# Patient Record
Sex: Female | Born: 1965 | Race: White | Hispanic: No | Marital: Married | Smoking: Former smoker
Health system: Southern US, Community
[De-identification: ages and names within clinical notes are randomized; demographics above are authoritative.]

## PROBLEM LIST (undated history)

## (undated) DIAGNOSIS — K219 Gastro-esophageal reflux disease without esophagitis: Secondary | ICD-10-CM

## (undated) DIAGNOSIS — Z720 Tobacco use: Secondary | ICD-10-CM

## (undated) DIAGNOSIS — J45909 Unspecified asthma, uncomplicated: Secondary | ICD-10-CM

## (undated) DIAGNOSIS — I10 Essential (primary) hypertension: Secondary | ICD-10-CM

---

## 1999-01-05 HISTORY — PX: CHOLECYSTECTOMY: SHX55

## 2005-12-20 ENCOUNTER — Ambulatory Visit: Payer: Self-pay | Admitting: Family Medicine

## 2006-03-20 ENCOUNTER — Emergency Department: Payer: Self-pay | Admitting: Emergency Medicine

## 2006-08-23 ENCOUNTER — Emergency Department: Payer: Self-pay | Admitting: Emergency Medicine

## 2006-10-12 ENCOUNTER — Ambulatory Visit: Payer: Self-pay

## 2006-10-17 ENCOUNTER — Emergency Department: Payer: Self-pay

## 2009-11-29 ENCOUNTER — Emergency Department: Payer: Self-pay | Admitting: Emergency Medicine

## 2012-01-07 ENCOUNTER — Emergency Department: Payer: Self-pay | Admitting: Emergency Medicine

## 2012-11-15 ENCOUNTER — Emergency Department: Payer: Self-pay | Admitting: Emergency Medicine

## 2014-02-27 ENCOUNTER — Emergency Department: Payer: Self-pay | Admitting: Emergency Medicine

## 2014-06-03 ENCOUNTER — Emergency Department
Admission: EM | Admit: 2014-06-03 | Discharge: 2014-06-03 | Disposition: A | Discharge status: Home / Self Care | Payer: Self-pay | Attending: Emergency Medicine | Admitting: Emergency Medicine

## 2014-06-03 ENCOUNTER — Encounter: Payer: Self-pay | Admitting: Emergency Medicine

## 2014-06-03 ENCOUNTER — Emergency Department: Payer: Self-pay

## 2014-06-03 DIAGNOSIS — Z720 Tobacco use: Secondary | ICD-10-CM | POA: Insufficient documentation

## 2014-06-03 DIAGNOSIS — Z3202 Encounter for pregnancy test, result negative: Secondary | ICD-10-CM | POA: Insufficient documentation

## 2014-06-03 DIAGNOSIS — K529 Noninfective gastroenteritis and colitis, unspecified: Secondary | ICD-10-CM | POA: Insufficient documentation

## 2014-06-03 LAB — CBC WITH DIFFERENTIAL/PLATELET
Basophils Absolute: 0.1 10*3/uL (ref 0–0.1)
Basophils Relative: 1 %
Eosinophils Absolute: 0 10*3/uL (ref 0–0.7)
Eosinophils Relative: 0 %
HCT: 46 % (ref 35.0–47.0)
Hemoglobin: 15.6 g/dL (ref 12.0–16.0)
Lymphocytes Relative: 15 %
Lymphs Abs: 1.3 10*3/uL (ref 1.0–3.6)
MCH: 29.4 pg (ref 26.0–34.0)
MCHC: 33.8 g/dL (ref 32.0–36.0)
MCV: 86.8 fL (ref 80.0–100.0)
MONO ABS: 0.3 10*3/uL (ref 0.2–0.9)
Monocytes Relative: 3 %
NEUTROS ABS: 6.9 10*3/uL — AB (ref 1.4–6.5)
NEUTROS PCT: 81 %
PLATELETS: 346 10*3/uL (ref 150–440)
RBC: 5.3 MIL/uL — ABNORMAL HIGH (ref 3.80–5.20)
RDW: 12.9 % (ref 11.5–14.5)
WBC: 8.5 10*3/uL (ref 3.6–11.0)

## 2014-06-03 LAB — COMPREHENSIVE METABOLIC PANEL
ALK PHOS: 82 U/L (ref 38–126)
ALT: 17 U/L (ref 14–54)
ANION GAP: 11 (ref 5–15)
AST: 21 U/L (ref 15–41)
Albumin: 4.6 g/dL (ref 3.5–5.0)
BILIRUBIN TOTAL: 0.4 mg/dL (ref 0.3–1.2)
BUN: 12 mg/dL (ref 6–20)
CALCIUM: 8.8 mg/dL — AB (ref 8.9–10.3)
CHLORIDE: 96 mmol/L — AB (ref 101–111)
CO2: 26 mmol/L (ref 22–32)
CREATININE: 0.73 mg/dL (ref 0.44–1.00)
GFR calc Af Amer: >60 mL/min (ref >60)
GFR calc non Af Amer: >60 mL/min (ref >60)
GLUCOSE: 128 mg/dL — AB (ref 65–99)
POTASSIUM: 3.3 mmol/L — AB (ref 3.5–5.1)
Sodium: 133 mmol/L — ABNORMAL LOW (ref 135–145)
Total Protein: 8.1 g/dL (ref 6.5–8.1)

## 2014-06-03 LAB — URINALYSIS COMPLETE WITH MICROSCOPIC (ARMC ONLY)
Bacteria, UA: NONE SEEN
Bilirubin Urine: NEGATIVE
Glucose, UA: NEGATIVE mg/dL
KETONES UR: NEGATIVE mg/dL
Leukocytes, UA: NEGATIVE
Nitrite: NEGATIVE
PH: 7 (ref 5.0–8.0)
PROTEIN: NEGATIVE mg/dL
Specific Gravity, Urine: 1.02 (ref 1.005–1.030)

## 2014-06-03 LAB — PREGNANCY, URINE: Preg Test, Ur: NEGATIVE

## 2014-06-03 MED ORDER — SODIUM CHLORIDE 0.9 % IV BOLUS (SEPSIS)
1000.0000 mL | Freq: Once | INTRAVENOUS | Status: AC
  Administered 2014-06-03: 1000 mL via INTRAVENOUS

## 2014-06-03 MED ORDER — PROMETHAZINE HCL 25 MG/ML IJ SOLN
INTRAMUSCULAR | Status: AC
  Administered 2014-06-03: 25 mg via INTRAVENOUS
  Filled 2014-06-03: qty 1

## 2014-06-03 MED ORDER — PROMETHAZINE HCL 25 MG/ML IJ SOLN
25.0000 mg | Freq: Once | INTRAMUSCULAR | Status: AC
  Administered 2014-06-03: 25 mg via INTRAVENOUS

## 2014-06-03 MED ORDER — PROMETHAZINE HCL 12.5 MG PO TABS: 12.5000 mg | ORAL_TABLET | Freq: Four times a day (QID) | ORAL | Status: DC | PRN

## 2014-06-03 NOTE — ED Notes (Signed)
Assisted patient up to bathroom and back into bed. 

## 2014-06-03 NOTE — ED Notes (Signed)
Patient assisted up to bathroom and back to into bed.

## 2014-06-03 NOTE — ED Notes (Signed)
C/o n,v,d that started today, denies any abd. Pain at present

## 2014-06-03 NOTE — ED Notes (Signed)

## 2014-06-03 NOTE — ED Notes (Signed)
Patient transported to X-ray 

## 2014-06-03 NOTE — ED Notes (Signed)
Patient transported back from X-ray 

## 2014-06-03 NOTE — Discharge Instructions (Signed)
Viral Gastroenteritis Viral gastroenteritis is also called stomach flu. This illness is caused by a certain type of germ (virus). It can cause sudden watery poop (diarrhea) and throwing up (vomiting). This can cause you to lose body fluids (dehydration). This illness usually lasts for 3 to 8 days. It usually goes away on its own. HOME CARE   Drink enough fluids to keep your pee (urine) clear or pale yellow. Drink small amounts of fluids often.  Ask your doctor how to replace body fluid losses (rehydration).  Avoid:  Foods high in sugar.  Alcohol.  Bubbly (carbonated) drinks.  Tobacco.  Juice.  Caffeine drinks.  Very hot or cold fluids.  Fatty, greasy foods.  Eating too much at one time.  Dairy products until 24 to 48 hours after your watery poop stops.  You may eat foods with active cultures (probiotics). They can be found in some yogurts and supplements.  Wash your hands well to avoid spreading the illness.  Only take medicines as told by your doctor. Do not give aspirin to children. Do not take medicines for watery poop (antidiarrheals).  Ask your doctor if you should keep taking your regular medicines.  Keep all doctor visits as told. GET HELP RIGHT AWAY IF:   You cannot keep fluids down.  You do not pee at least once every 6 to 8 hours.  You are short of breath.  You see blood in your poop or throw up. This may look like coffee grounds.  You have belly (abdominal) pain that gets worse or is just in one small spot (localized).  You keep throwing up or having watery poop.  You have a fever.  The patient is a child younger than 3 months, and he or she has a fever.  The patient is a child older than 3 months, and he or she has a fever and problems that do not go away.  The patient is a child older than 3 months, and he or she has a fever and problems that suddenly get worse.  The patient is a baby, and he or she has no tears when crying. MAKE SURE YOU:     Understand these instructions.  Will watch your condition.  Will get help right away if you are not doing well or get worse. Document Released: 06/09/2007 Document Revised: 03/15/2011 Document Reviewed: 10/07/2010 Exit[employer] Patient Information 2015 [employer]. This information is not intended to replace advice given to you by your health care provider. Make sure you discuss any questions you have with your health care provider.  

## 2014-06-03 NOTE — ED Notes (Signed)
Patient presents to ED with complaint of nausea, vomiting, diarrhea since about noon today, patient reports has slight abdominal pain "I think its from all the vomiting I have been doing." Patient states has had 6-7 emesis episodes and 2 diarrhea episodes. Patient also complaining of severe migraine. Patient denies any blood in emesis or stool. Denies any other symptoms. Patient alert and oriented, respirations even and unlabored. Will continue to monitor.

## 2014-06-03 NOTE — ED Provider Notes (Signed)
[facility] Emergency Department Provider Note  Time seen: 6:52 PM  I have reviewed the triage vital signs and the nursing notes.   HISTORY  Chief Complaint Emesis and Diarrhea    HPI Vickie Hamilton is a 49 y.o. female with no past medical history who presents the emergency department with nausea/vomiting/diarrhea for last 4 hours. According to the patient she developed nausea/vomiting, which she states is not too atypical for her since 2001 when she had her gallbladder removed. However she states that vomiting turned a bright green color which concerned her so she came to the emergency department for evaluation. She states some diarrhea and denies any blood or black. Denies any abdominal pain. Denies any dysuria. Denies any fever.    No past medical history on file.  There are no active problems to display for this patient.   No past surgical history on file.  No current outpatient prescriptions on file.  Allergies Asa and Naproxen  No family history on file.  Social History History  Substance Use Topics  . Smoking status: Current Every Day Smoker  . Smokeless tobacco: Not on file  . Alcohol Use: No    Review of Systems Constitutional: Negative for fever. Cardiovascular: Negative for chest pain. Respiratory: Negative for shortness of breath. Gastrointestinal: Negative for abdominal pain. Positive for nausea/vomiting/diarrhea. Genitourinary: Negative for dysuria. Musculoskeletal: Negative for back pain. 10-point ROS otherwise negative.  ____________________________________________   PHYSICAL EXAM:  VITAL SIGNS: ED Triage Vitals  Enc Vitals Group     BP 06/03/14 1734 157/97 mmHg     Pulse Rate 06/03/14 1734 104     Resp 06/03/14 1734 20     Temp 06/03/14 1734 98.2 F (36.8 C)     Temp Source 06/03/14 1734 Oral     SpO2 06/03/14 1734 96 %     Weight 06/03/14 1734 175 lb (79.379 kg)     Height 06/03/14 1734 5\' 4"  (1.626 m)   Head Cir --      Peak Flow --      Pain Score --      Pain Loc --      Pain Edu? --      Excl. in GC? --     Constitutional: Alert and oriented. Well appearing and in no distress. ENT   Head: Normocephalic   Mouth/Throat: Mucous membranes are moist. Cardiovascular: Normal rate, regular rhythm. No murmurs Respiratory: Normal respiratory effort without tachypnea nor retractions. Breath sounds are clear  Gastrointestinal: Soft and nontender. No distention.  Benign abdominal exam. Musculoskeletal: Nontender with normal range of motion in all extremities Neurologic:  Normal speech and language. No gross focal neurologic deficits  Skin:  Skin is warm, dry and intact.  Psychiatric: Mood and affect are normal. Speech and behavior are normal.  ____________________________________________    RADIOLOGY three-way abdominal exam within normal limits.  ____________________________________________   INITIAL IMPRESSION / ASSESSMENT AND PLAN / ED COURSE  Pertinent labs & imaging results that were available during my care of the patient were reviewed by me and considered in my medical decision making (see chart for details).  Nausea, vomiting, diarrhea 4 hours. Patient has no abdominal tenderness to palpation. We will check labs, IV hydrate, control nausea. Patient agreeable to plan. Overall patient appears very well currently.   ----------------------------------------- 8:05 PM on 06/03/2014 -----------------------------------------  Urinalysis is within normal limits.  Chest x-rays come back within normal limits. Patient states she feels much better after fluids and Phenergan. We  will discharge home with Phenergan as needed, and primary care follow-up. Discussed strict return precautions with the patient. ____________________________________________   FINAL CLINICAL IMPRESSION(S) / ED DIAGNOSES  Nausea/vomiting Diarrhea   Minna Antis, MD 06/03/14 2057

## 2015-01-02 ENCOUNTER — Emergency Department: Payer: Managed Care, Other (non HMO)

## 2015-01-02 ENCOUNTER — Encounter: Payer: Self-pay | Admitting: *Deleted

## 2015-01-02 ENCOUNTER — Other Ambulatory Visit: Payer: Self-pay

## 2015-01-02 ENCOUNTER — Emergency Department
Admission: EM | Admit: 2015-01-02 | Discharge: 2015-01-02 | Disposition: A | Discharge status: Home / Self Care | Payer: Managed Care, Other (non HMO) | Attending: Emergency Medicine | Admitting: Emergency Medicine

## 2015-01-02 DIAGNOSIS — F17200 Nicotine dependence, unspecified, uncomplicated: Secondary | ICD-10-CM | POA: Diagnosis not present

## 2015-01-02 DIAGNOSIS — J9801 Acute bronchospasm: Secondary | ICD-10-CM | POA: Insufficient documentation

## 2015-01-02 DIAGNOSIS — R05 Cough: Secondary | ICD-10-CM | POA: Diagnosis present

## 2015-01-02 MED ORDER — METHYLPREDNISOLONE 4 MG PO TBPK
ORAL_TABLET | ORAL | Status: DC
Start: 2015-01-02 — End: 2021-07-30

## 2015-01-02 MED ORDER — IPRATROPIUM-ALBUTEROL 0.5-2.5 (3) MG/3ML IN SOLN
3.0000 mL | Freq: Once | RESPIRATORY_TRACT | Status: AC
  Administered 2015-01-02: 3 mL via RESPIRATORY_TRACT
  Filled 2015-01-02: qty 3

## 2015-01-02 MED ORDER — HYDROCOD POLST-CPM POLST ER 10-8 MG/5ML PO SUER: 5.0000 mL | Freq: Two times a day (BID) | ORAL | Status: DC

## 2015-01-02 NOTE — Discharge Instructions (Signed)
Bronchospasm, Adult A bronchospasm is when the tubes that carry air in and out of your lungs (airways) spasm or tighten. During a bronchospasm it is hard to breathe. This is because the airways get smaller. A bronchospasm can be triggered by:  Allergies. These may be to animals, pollen, food, or mold.  Infection. This is a common cause of bronchospasm.  Exercise.  Irritants. These include pollution, cigarette smoke, strong odors, aerosol sprays, and paint fumes.  Weather changes.  Stress.  Being emotional. HOME CARE   Always have a plan for getting help. Know when to call your doctor and local emergency services (911 in the [place] Know where you can get emergency care.  Only take medicines as told by your doctor.  If you were prescribed an inhaler or nebulizer machine, ask your doctor how to use it correctly. Always use a spacer with your inhaler if you were given one.  Stay calm during an attack. Try to relax and breathe more slowly.  Control your home environment:  Change your heating and air conditioning filter at least once a month.  Limit your use of fireplaces and wood stoves.  Do not  smoke. Do not  allow smoking in your home.  Avoid perfumes and fragrances.  Get rid of pests (such as roaches and mice) and their droppings.  Throw away plants if you see mold on them.  Keep your house clean and dust free.  Replace carpet with wood, tile, or vinyl flooring. Carpet can trap dander and dust.  Use allergy-proof pillows, mattress covers, and box spring covers.  Wash bed sheets and blankets every week in hot water. Dry them in a dryer.  Use blankets that are made of polyester or cotton.  Wash hands frequently. GET HELP IF:  You have muscle aches.  You have chest pain.  The thick spit you spit or cough up (sputum) changes from clear or white to yellow, green, gray, or bloody.  The thick spit you spit or cough up gets thicker.  There are problems that may be  related to the medicine you are given such as:  A rash.  Itching.  Swelling.  Trouble breathing. GET HELP RIGHT AWAY IF:  You feel you cannot breathe or catch your breath.  You cannot stop coughing.  Your treatment is not helping you breathe better.  You have very bad chest pain. MAKE SURE YOU:   Understand these instructions.  Will watch your condition.  Will get help right away if you are not doing well or get worse.   This information is not intended to replace advice given to you by your health care provider. Make sure you discuss any questions you have with your health care provider.   Document Released: 10/18/2008 Document Revised: 01/11/2014 Document Reviewed: 06/13/2012 Elsevier Interactive Patient Education 2016 [employer]  

## 2015-01-02 NOTE — ED Notes (Signed)
Assessed per PA 

## 2015-01-02 NOTE — ED Notes (Signed)
C/o cough, cold symptoms, chest tightness,

## 2015-01-02 NOTE — ED Provider Notes (Signed)
[facility] Emergency Department Provider Note  ____________________________________________  Time seen: Approximately 4:54 PM  I have reviewed the triage vital signs and the nursing notes.   HISTORY  Chief Complaint Cough    HPI Sampson Gooneresa L Bleiler is a 49 y.o. female patient complaining of 3 weeks of cough congestion and chest tightness. Patient also complaining of nasal congestion postnasal drainage. Patient denies any nausea vomiting diarrhea. Patient denies any chest pain. Patient states exertional shortness of breath.Patient admits to tobacco use. No palliative measures taken. Patient denies any pain at this time.  History reviewed. No pertinent past medical history.  There are no active problems to display for this patient.   History reviewed. No pertinent past surgical history.  Current Outpatient Rx  Name  Route  Sig  Dispense  Refill  . chlorpheniramine-HYDROcodone (TUSSIONEX PENNKINETIC ER) 10-8 MG/5ML SUER   Oral   Take 5 mLs by mouth 2 (two) times daily.   115 mL   0   . methylPREDNISolone (MEDROL DOSEPAK) 4 MG TBPK tablet      Take Tapered dose as directed   21 tablet   0   . promethazine (PHENERGAN) 12.5 MG tablet   Oral   Take 1 tablet (12.5 mg total) by mouth every 6 (six) hours as needed for nausea or vomiting.   15 tablet   0     Allergies Asa and Naproxen  No family history on file.  Social History Social History  Substance Use Topics  . Smoking status: Current Every Day Smoker  . Smokeless tobacco: None  . Alcohol Use: No    Review of Systems Constitutional: No fever/chills Eyes: No visual changes. ENT: No sore throat. Cardiovascular: Denies chest pain. Respiratory: Exertional shortness of breath. Nonproductive cough. Chest congestion Gastrointestinal: No abdominal pain.  No nausea, no vomiting.  No diarrhea.  No constipation. Genitourinary: Negative for dysuria. Musculoskeletal: Negative for back  pain. Skin: Negative for rash. Neurological: Negative for headaches, focal weakness or numbness. Allergic/Immunilogical: Aspirin and naproxen  10-point ROS otherwise negative.  ____________________________________________   PHYSICAL EXAM:  VITAL SIGNS: ED Triage Vitals  Enc Vitals Group     BP 01/02/15 1616 165/91 mmHg     Pulse Rate 01/02/15 1616 93     Resp 01/02/15 1616 24     Temp 01/02/15 1616 98.7 F (37.1 C)     Temp Source 01/02/15 1616 Oral     SpO2 01/02/15 1616 96 %     Weight 01/02/15 1616 180 lb (81.647 kg)     Height 01/02/15 1616 5\' 4"  (1.626 m)     Head Cir --      Peak Flow --      Pain Score --      Pain Loc --      Pain Edu? --      Excl. in GC? --     Constitutional: Alert and oriented. Well appearing and in no acute distress. Eyes: Conjunctivae are normal. PERRL. EOMI. Head: Atraumatic. Nose: No congestion/rhinnorhea. Mouth/Throat: Mucous membranes are moist.  Oropharynx non-erythematous. Neck: No stridor. No cervical spine tenderness to palpation. Hematological/Lymphatic/Immunilogical: No cervical lymphadenopathy. Cardiovascular: Normal rate, regular rhythm. Grossly normal heart sounds.  Good peripheral circulation. Elevated blood pressure  Respiratory: Normal respiratory effort.  No retractions.mild Rales. Gastrointestinal: Soft and nontender. No distention. No abdominal bruits. No CVA tenderness. Musculoskeletal: No lower extremity tenderness nor edema.  No joint effusions. Neurologic:  Normal speech and language. No gross focal neurologic deficits are  appreciated. No gait instability. Skin:  Skin is warm, dry and intact. No rash noted. Psychiatric: Mood and affect are normal. Speech and behavior are normal.  ____________________________________________   LABS (all labs ordered are listed, but only abnormal results are displayed)  Labs Reviewed - No data to display __________________________________Read by heart station doctors no acute  findings._____________________  RADIOLOGY  No acute findings on chest x-ray ____________________________________________   PROCEDURES  Procedure(s) performed: None  Critical Care performed: No  ____________________________________________   INITIAL IMPRESSION / ASSESSMENT AND PLAN / ED COURSE  Pertinent labs & imaging results that were available during my care of the patient were reviewed by me and considered in my medical decision making (see chart for details).  Cough secondary to bronchospasm. As patient discontinue smoking. She given a prescription for Bromfed-DM prednisone. [facility] Patient given a work note for today. ____________________________________________   FINAL CLINICAL IMPRESSION(S) / ED DIAGNOSES  Final diagnoses:  Cough due to bronchospasm      Joni Reining, PA-C 01/02/15 1821  Phineas Semen, MD 01/07/15 [phone]

## 2015-06-06 ENCOUNTER — Emergency Department: Payer: Managed Care, Other (non HMO)

## 2015-06-06 ENCOUNTER — Emergency Department
Admission: EM | Admit: 2015-06-06 | Discharge: 2015-06-06 | Disposition: A | Discharge status: Home / Self Care | Payer: Managed Care, Other (non HMO) | Attending: Emergency Medicine | Admitting: Emergency Medicine

## 2015-06-06 DIAGNOSIS — F17200 Nicotine dependence, unspecified, uncomplicated: Secondary | ICD-10-CM | POA: Diagnosis not present

## 2015-06-06 DIAGNOSIS — J209 Acute bronchitis, unspecified: Secondary | ICD-10-CM | POA: Insufficient documentation

## 2015-06-06 DIAGNOSIS — E876 Hypokalemia: Secondary | ICD-10-CM

## 2015-06-06 DIAGNOSIS — R0789 Other chest pain: Secondary | ICD-10-CM | POA: Diagnosis present

## 2015-06-06 LAB — COMPREHENSIVE METABOLIC PANEL
ALBUMIN: 4.3 g/dL (ref 3.5–5.0)
ALK PHOS: 84 U/L (ref 38–126)
ALT: 20 U/L (ref 14–54)
AST: 27 U/L (ref 15–41)
Anion gap: 10 (ref 5–15)
BILIRUBIN TOTAL: 0.5 mg/dL (ref 0.3–1.2)
BUN: 12 mg/dL (ref 6–20)
CALCIUM: 9.3 mg/dL (ref 8.9–10.3)
CO2: 25 mmol/L (ref 22–32)
Chloride: 105 mmol/L (ref 101–111)
Creatinine, Ser: 0.83 mg/dL (ref 0.44–1.00)
GFR calc Af Amer: >60 mL/min (ref >60)
GFR calc non Af Amer: >60 mL/min (ref >60)
GLUCOSE: 175 mg/dL — AB (ref 65–99)
Potassium: 3.1 mmol/L — ABNORMAL LOW (ref 3.5–5.1)
SODIUM: 140 mmol/L (ref 135–145)
TOTAL PROTEIN: 7.4 g/dL (ref 6.5–8.1)

## 2015-06-06 LAB — CBC
HCT: 41 % (ref 35.0–47.0)
HEMOGLOBIN: 14.3 g/dL (ref 12.0–16.0)
MCH: 29.6 pg (ref 26.0–34.0)
MCHC: 34.9 g/dL (ref 32.0–36.0)
MCV: 84.9 fL (ref 80.0–100.0)
Platelets: 293 10*3/uL (ref 150–440)
RBC: 4.83 MIL/uL (ref 3.80–5.20)
RDW: 13.3 % (ref 11.5–14.5)
WBC: 8.7 10*3/uL (ref 3.6–11.0)

## 2015-06-06 LAB — TROPONIN I: Troponin I: <0.03 ng/mL (ref <0.031)

## 2015-06-06 MED ORDER — HYDROCODONE-HOMATROPINE 5-1.5 MG/5ML PO SYRP: 5.0000 mL | ORAL_SOLUTION | Freq: Four times a day (QID) | ORAL | Status: DC | PRN

## 2015-06-06 MED ORDER — AMOXICILLIN-POT CLAVULANATE 875-125 MG PO TABS: 1.0000 | ORAL_TABLET | Freq: Two times a day (BID) | ORAL | Status: DC

## 2015-06-06 MED ORDER — ALBUTEROL SULFATE (2.5 MG/3ML) 0.083% IN NEBU
2.5000 mg | INHALATION_SOLUTION | Freq: Once | RESPIRATORY_TRACT | Status: AC
Start: 2015-06-06 — End: 2015-06-06
  Administered 2015-06-06: 2.5 mg via RESPIRATORY_TRACT
  Filled 2015-06-06: qty 3

## 2015-06-06 MED ORDER — PREDNISONE 10 MG PO TABS: ORAL_TABLET | ORAL | Status: DC

## 2015-06-06 MED ORDER — POTASSIUM CHLORIDE CRYS ER 20 MEQ PO TBCR
20.0000 meq | EXTENDED_RELEASE_TABLET | Freq: Once | ORAL | Status: AC
  Administered 2015-06-06: 20 meq via ORAL
  Filled 2015-06-06: qty 1

## 2015-06-06 MED ORDER — ALBUTEROL SULFATE HFA 108 (90 BASE) MCG/ACT IN AERS: 1.0000 | INHALATION_SPRAY | Freq: Four times a day (QID) | RESPIRATORY_TRACT | Status: DC | PRN

## 2015-06-06 NOTE — ED Notes (Signed)
Pt states that she started having rt sided chest tightness Tuesday afternoon, states that it hurts to breathe, she feels sob, has some wheezing and is coughing, with a yellowish green sputum, pt reports some fever as well. Pt denies hx of pneumonia

## 2015-06-06 NOTE — ED Provider Notes (Signed)
[facility] Emergency Department Provider Note  ____________________________________________  Time seen: Approximately 11:31 AM  I have reviewed the triage vital signs and the nursing notes.   HISTORY  Chief Complaint Chest Pain    HPI Vickie Hamilton is a 50 y.o. female , NAD, presents to the emergency department with 3 day history of right-sided chest tightness, shortness of breath, wheezing, coughing and chest congestion. States that her symptoms began Tuesday afternoon. Felt she was getting better yesterday but last night had multiple coughing fits causing lack of sleep. States that her chest hurts when she coughs. Has noted some wheezing. Cough is productive of yellowish-green sputum. States she's been exposed to a relative who has also been ill with similar symptoms in the recent past. Did have fever and chills in the beginning of the illness which is now resolved with utilizing Tylenol over-the-counter. Denies any overt chest pain has not had any changes in her vision. Denies changes in her speech or gait. Has not had any numbness, weakness, tingling. Does have a 40+ pack year smoking history and states smoking half a pack a day at this time.   No past medical history on file.  There are no active problems to display for this patient.   No past surgical history on file.  Current Outpatient Rx  Name  Route  Sig  Dispense  Refill  . albuterol (PROVENTIL HFA;VENTOLIN HFA) 108 (90 Base) MCG/ACT inhaler   Inhalation   Inhale 1-2 puffs into the lungs every 6 (six) hours as needed for wheezing or shortness of breath.   1 Inhaler   0   . amoxicillin-clavulanate (AUGMENTIN) 875-125 MG tablet   Oral   Take 1 tablet by mouth 2 (two) times daily.   20 tablet   0   . chlorpheniramine-HYDROcodone (TUSSIONEX PENNKINETIC ER) 10-8 MG/5ML SUER   Oral   Take 5 mLs by mouth 2 (two) times daily.   115 mL   0   . HYDROcodone-homatropine (HYCODAN) 5-1.5 MG/5ML  syrup   Oral   Take 5 mLs by mouth every 6 (six) hours as needed for cough.   50 mL   0   . methylPREDNISolone (MEDROL DOSEPAK) 4 MG TBPK tablet      Take Tapered dose as directed   21 tablet   0   . predniSONE (DELTASONE) 10 MG tablet      Take a daily regimen of 6,5,4,3,2,1   21 tablet   0   . promethazine (PHENERGAN) 12.5 MG tablet   Oral   Take 1 tablet (12.5 mg total) by mouth every 6 (six) hours as needed for nausea or vomiting.   15 tablet   0     Allergies Asa and Naproxen  No family history on file.  Social History Social History  Substance Use Topics  . Smoking status: Current Every Day Smoker  . Smokeless tobacco: Not on file  . Alcohol Use: No     Review of Systems Constitutional: Positive fever, chills, sweats that have resolved. No fatigue Eyes: No visual changes. No discharge, redness, swelling ENT: No sore throat, nasal congestion, ear pain, sinus pressure, runny nose, sneezing. Cardiovascular: No chest pain. Respiratory: Positive shortness of breath, wheezing due to productive cough with green sputum and chest congestion.  Gastrointestinal: No abdominal pain.  No nausea, vomiting.  No diarrhea. Genitourinary: Negative for dysuria. No hematuria. No urinary hesitancy, urgency or increased frequency. Musculoskeletal: Negative for general myalgias. No back pain Skin: Negative  for rash, skin sores. Neurological: Negative for headaches, focal weakness or numbness. No tingling. No changes in speech or gait. Endocrine:  No polyuria or polydipsia 10-point ROS otherwise negative.  ____________________________________________   PHYSICAL EXAM:  VITAL SIGNS: ED Triage Vitals  Enc Vitals Group     BP 06/06/15 0857 159/93 mmHg     Pulse Rate 06/06/15 0857 107     Resp 06/06/15 0857 20     Temp 06/06/15 0857 98.4 F (36.9 C)     Temp Source 06/06/15 0857 Oral     SpO2 06/06/15 0857 96 %     Weight 06/06/15 0857 180 lb (81.647 kg)     Height  06/06/15 0857 5\' 4"  (1.626 m)     Head Cir --      Peak Flow --      Pain Score 06/06/15 0858 0     Pain Loc --      Pain Edu? --      Excl. in GC? --      Constitutional: Alert and oriented. Well appearing and in no acute distress. Eyes: Conjunctivae are normal. PERRL. EOMI without pain.  Head: Atraumatic. ENT:      Ears: TMs visualized bilaterally without effusion, bulging, perforation, erythema.      Nose: No congestion/rhinnorhea.      Mouth/Throat: Mucous membranes are moist. Nares without erythema, swelling, exudate. Uvula is midline. Neck: No stridor. Supple with full range of motion Hematological/Lymphatic/Immunilogical: No cervical lymphadenopathy. Cardiovascular: Normal rate, regular rhythm. Normal S1 and S2.  Good peripheral circulation with 2+ pulses noted in the right upper extremity. Respiratory: Normal respiratory effort without tachypnea or retractions. Mild wheeze noted in right middle lung field but breath sounds are noted throughout. All other lung fields are clear to auscultation. Neurologic:  Normal speech and language. No gross focal neurologic deficits are appreciated. Gait and posture are normal Skin:  Skin is warm, dry and intact. No rash noted. Psychiatric: Mood and affect are normal. Speech and behavior are normal. Patient exhibits appropriate insight and judgement.   ____________________________________________   LABS (all labs ordered are listed, but only abnormal results are displayed)  Labs Reviewed  COMPREHENSIVE METABOLIC PANEL - Abnormal; Notable for the following:    Potassium 3.1 (*)    Glucose, Bld 175 (*)    All other components within normal limits  CBC  TROPONIN I   ____________________________________________  EKG  EKG reveals sinus tachycardia with a ventricular rate of 105 bpm but no acute changes nor STEMI. EKG was also reviewed by Dr. Loreli Dollar. ____________________________________________  RADIOLOGY I have personally  viewed and evaluated these images (plain radiographs) as part of my medical decision making, as well as reviewing the written report by the radiologist.  Dg Chest 2 View  06/06/2015  CLINICAL DATA:  Chest tightness, shortness of breath and productive cough. Fever. Three days duration. EXAM: CHEST  2 VIEW COMPARISON:  01/02/2015.  06/03/2014. FINDINGS: Heart size is normal. Mediastinal shadows are normal. No infiltrate, collapse or effusion. There may be central bronchial thickening. No bony abnormality. IMPRESSION: Possible bronchitis.  No consolidation or collapse. Electronically Signed   By: Paulina Fusi M.D.   On: 06/06/2015 09:25    ____________________________________________    PROCEDURES  Procedure(s) performed: None    Medications  albuterol (PROVENTIL) (2.5 MG/3ML) 0.083% nebulizer solution 2.5 mg (2.5 mg Nebulization Given 06/06/15 1149)  potassium chloride SA (K-DUR,KLOR-CON) CR tablet 20 mEq (20 mEq Oral Given 06/06/15 1236)   Patient notes  feeling better since administration of albuterol nebulized solution. Notes decreased chest tightness as well as cough.  ____________________________________________   INITIAL IMPRESSION / ASSESSMENT AND PLAN / ED COURSE  Pertinent labs & imaging results that were available during my care of the patient were reviewed by me and considered in my medical decision making (see chart for details).  Patient's diagnosis is consistent with acute bronchitis, hypokalemia, hyperglycemia and tobacco dependence. Patient will be discharged home with prescriptions for Augmentin, prednisone, albuterol inhaler, Hycodan cough syrup to take as directed. Patient was made aware of low potassium and elevated glucose in which she will follow-up with [facility] in one week for recheck. Patient is given strict ED precautions to return to the ED for any worsening or new symptoms.      ____________________________________________  FINAL CLINICAL  IMPRESSION(S) / ED DIAGNOSES  Final diagnoses:  Acute bronchitis, unspecified organism  Hypokalemia  Tobacco dependence      NEW MEDICATIONS STARTED DURING THIS VISIT:  Discharge Medication List as of 06/06/2015 12:39 PM    START taking these medications   Details  albuterol (PROVENTIL HFA;VENTOLIN HFA) 108 (90 Base) MCG/ACT inhaler Inhale 1-2 puffs into the lungs every 6 (six) hours as needed for wheezing or shortness of breath., Starting 06/06/2015, Until Discontinued, Print    amoxicillin-clavulanate (AUGMENTIN) 875-125 MG tablet Take 1 tablet by mouth 2 (two) times daily., Starting 06/06/2015, Until Discontinued, Print    HYDROcodone-homatropine (HYCODAN) 5-1.5 MG/5ML syrup Take 5 mLs by mouth every 6 (six) hours as needed for cough., Starting 06/06/2015, Until Discontinued, Print    predniSONE (DELTASONE) 10 MG tablet Take a daily regimen of 6,5,4,3,2,1, Print             Hope Pigeon, PA-C 06/06/15 1311  Myrna Blazer, MD 06/06/15 1535

## 2015-06-06 NOTE — Discharge Instructions (Signed)
Acute Bronchitis Bronchitis is inflammation of the airways that extend from the windpipe into the lungs (bronchi). The inflammation often causes mucus to develop. This leads to a cough, which is the most common symptom of bronchitis.  In acute bronchitis, the condition usually develops suddenly and goes away over time, usually in a couple weeks. Smoking, allergies, and asthma can make bronchitis worse. Repeated episodes of bronchitis may cause further lung problems.  CAUSES Acute bronchitis is most often caused by the same virus that causes a cold. The virus can spread from person to person (contagious) through coughing, sneezing, and touching contaminated objects. SIGNS AND SYMPTOMS   Cough.   Fever.   Coughing up mucus.   Body aches.   Chest congestion.   Chills.   Shortness of breath.   Sore throat.  DIAGNOSIS  Acute bronchitis is usually diagnosed through a physical exam. Your health care provider will also ask you questions about your medical history. Tests, such as chest X-rays, are sometimes done to rule out other conditions.  TREATMENT  Acute bronchitis usually goes away in a couple weeks. Oftentimes, no medical treatment is necessary. Medicines are sometimes given for relief of fever or cough. Antibiotic medicines are usually not needed but may be prescribed in certain situations. In some cases, an inhaler may be recommended to help reduce shortness of breath and control the cough. A cool mist vaporizer may also be used to help thin bronchial secretions and make it easier to clear the chest.  HOME CARE INSTRUCTIONS  Get plenty of rest.   Drink enough fluids to keep your urine clear or pale yellow (unless you have a medical condition that requires fluid restriction). Increasing fluids may help thin your respiratory secretions (sputum) and reduce chest congestion, and it will prevent dehydration.   Take medicines only as directed by your health care provider.  If  you were prescribed an antibiotic medicine, finish it all even if you start to feel better.  Avoid smoking and secondhand smoke. Exposure to cigarette smoke or irritating chemicals will make bronchitis worse. If you are a smoker, consider using nicotine gum or skin patches to help control withdrawal symptoms. Quitting smoking will help your lungs heal faster.   Reduce the chances of another bout of acute bronchitis by washing your hands frequently, avoiding people with cold symptoms, and trying not to touch your hands to your mouth, nose, or eyes.   Keep all follow-up visits as directed by your health care provider.  SEEK MEDICAL CARE IF: Your symptoms do not improve after 1 week of treatment.  SEEK IMMEDIATE MEDICAL CARE IF:  You develop an increased fever or chills.   You have chest pain.   You have severe shortness of breath.  You have bloody sputum.   You develop dehydration.  You faint or repeatedly feel like you are going to pass out.  You develop repeated vomiting.  You develop a severe headache. MAKE SURE YOU:   Understand these instructions.  Will watch your condition.  Will get help right away if you are not doing well or get worse.   This information is not intended to replace advice given to you by your health care provider. Make sure you discuss any questions you have with your health care provider.   Document Released: 01/29/2004 Document Revised: 01/11/2014 Document Reviewed: 06/13/2012 Elsevier Interactive Patient Education 2016 Elsevier Inc.  Hypokalemia Hypokalemia means that the amount of potassium in the blood is lower than normal.Potassium is a Engineer, agriculturalchemical,  called an electrolyte, that helps regulate the amount of fluid in the body. It also stimulates muscle contraction and helps nerves function properly.Most of the body's potassium is inside of cells, and only a very small amount is in the blood. Because the amount in the blood is so small, minor  changes can be life-threatening. CAUSES  Antibiotics.  Diarrhea or vomiting.  Using laxatives too much, which can cause diarrhea.  Chronic kidney disease.  Water pills (diuretics).  Eating disorders (bulimia).  Low magnesium level.  Sweating a lot. SIGNS AND SYMPTOMS  Weakness.  Constipation.  Fatigue.  Muscle cramps.  Mental confusion.  Skipped heartbeats or irregular heartbeat (palpitations).  Tingling or numbness. DIAGNOSIS  Your health care provider can diagnose hypokalemia with blood tests. In addition to checking your potassium level, your health care provider may also check other lab tests. TREATMENT Hypokalemia can be treated with potassium supplements taken by mouth or adjustments in your current medicines. If your potassium level is very low, you may need to get potassium through a vein (IV) and be monitored in the hospital. A diet high in potassium is also helpful. Foods high in potassium are:  Nuts, such as peanuts and pistachios.  Seeds, such as sunflower seeds and pumpkin seeds.  Peas, lentils, and lima beans.  Whole grain and bran cereals and breads.  Fresh fruit and vegetables, such as apricots, avocado, bananas, cantaloupe, kiwi, oranges, tomatoes, asparagus, and potatoes.  Orange and tomato juices.  Red meats.  Fruit yogurt. HOME CARE INSTRUCTIONS  Take all medicines as prescribed by your health care provider.  Maintain a healthy diet by including nutritious food, such as fruits, vegetables, nuts, whole grains, and lean meats.  If you are taking a laxative, be sure to follow the directions on the label. SEEK MEDICAL CARE IF:  Your weakness gets worse.  You feel your heart pounding or racing.  You are vomiting or having diarrhea.  You are diabetic and having trouble keeping your blood glucose in the normal range. SEEK IMMEDIATE MEDICAL CARE IF:  You have chest pain, shortness of breath, or dizziness.  You are vomiting or having  diarrhea for more than 2 days.  You faint. MAKE SURE YOU:   Understand these instructions.  Will watch your condition.  Will get help right away if you are not doing well or get worse.   This information is not intended to replace advice given to you by your health care provider. Make sure you discuss any questions you have with your health care provider.   Document Released: 12/21/2004 Document Revised: 01/11/2014 Document Reviewed: 06/23/2012 Elsevier Interactive Patient Education 2016 [employer].   Steps to Quit Smoking    Smoking tobacco can be harmful to your health and can affect almost every organ in your body. Smoking puts you, and those around you, at risk for developing many serious chronic diseases. Quitting smoking is difficult, but it is one of the best things that you can do for your health. It is never too late to quit.  WHAT ARE THE BENEFITS OF QUITTING SMOKING?  When you quit smoking, you lower your risk of developing serious diseases and conditions, such as:  Lung cancer or lung disease, such as COPD.  Heart disease.  Stroke.  Heart attack.  Infertility.  Osteoporosis and bone fractures. Additionally, symptoms such as coughing, wheezing, and shortness of breath may get better when you quit. You may also find that you get sick less often because your body is  stronger at fighting off colds and infections. If you are pregnant, quitting smoking can help to reduce your chances of having a baby of low birth weight.  HOW DO I GET READY TO QUIT?  When you decide to quit smoking, create a plan to make sure that you are successful. Before you quit:  Pick a date to quit. Set a date within the next two weeks to give you time to prepare.  Write down the reasons why you are quitting. Keep this list in places where you will see it often, such as on your bathroom mirror or in your car or wallet.  Identify the people, places, things, and activities that make you want to smoke  (triggers) and avoid them. Make sure to take these actions:  Throw away all cigarettes at home, at work, and in your car.  Throw away smoking accessories, such as Set designer.  Clean your car and make sure to empty the ashtray.  Clean your home, including curtains and carpets. Tell your family, friends, and coworkers that you are quitting. Support from your loved ones can make quitting easier.  Talk with your health care provider about your options for quitting smoking.  Find out what treatment options are covered by your health insurance. WHAT STRATEGIES CAN I USE TO QUIT SMOKING?  Talk with your healthcare provider about different strategies to quit smoking. Some strategies include:  Quitting smoking altogether instead of gradually lessening how much you smoke over a period of time. Research shows that quitting "cold [place]" is more successful than gradually quitting.  Attending in-person counseling to help you build problem-solving skills. You are more likely to have success in quitting if you attend several counseling sessions. Even short sessions of 10 minutes can be effective.  Finding resources and support systems that can help you to quit smoking and remain smoke-free after you quit. These resources are most helpful when you use them often. They can include:  Online chats with a Veterinary surgeon.  Telephone quitlines.  Printed Materials engineer.  Support groups or group counseling.  Text messaging programs.  Mobile phone applications. Taking medicines to help you quit smoking. (If you are pregnant or breastfeeding, talk with your health care provider first.) Some medicines contain nicotine and some do not. Both types of medicines help with cravings, but the medicines that include nicotine help to relieve withdrawal symptoms. Your health care provider may recommend:  Nicotine patches, gum, or lozenges.  Nicotine inhalers or sprays.  Non-nicotine medicine that is taken by  mouth. Talk with your health care provider about combining strategies, such as taking medicines while you are also receiving in-person counseling. Using these two strategies together makes you more likely to succeed in quitting than if you used either strategy on its own.  If you are pregnant or breastfeeding, talk with your health care provider about finding counseling or other support strategies to quit smoking. Do not take medicine to help you quit smoking unless told to do so by your health care provider.  WHAT THINGS CAN I DO TO MAKE IT EASIER TO QUIT?  Quitting smoking might feel overwhelming at first, but there is a lot that you can do to make it easier. Take these important actions:  Reach out to your family and friends and ask that they support and encourage you during this time. Call telephone quitlines, reach out to support groups, or work with a counselor for support.  Ask people who smoke to avoid smoking around you.  Avoid places that trigger you to smoke, such as bars, parties, or smoke-break areas at work.  Spend time around people who do not smoke.  Lessen stress in your life, because stress can be a smoking trigger for some people. To lessen stress, try:  Exercising regularly.  Deep-breathing exercises.  Yoga.  Meditating.  Performing a body scan. This involves closing your eyes, scanning your body from head to toe, and noticing which parts of your body are particularly tense. Purposefully relax the muscles in those areas. Download or purchase mobile phone or tablet apps (applications) that can help you stick to your quit plan by providing reminders, tips, and encouragement. There are many free apps, such as QuitGuide from the [employer] developer for Disease Control and Prevention). You can find other support for quitting smoking (smoking cessation) through smokefree.gov and other websites. HOW WILL I FEEL WHEN I QUIT SMOKING?  Within the first 24 hours of quitting smoking, you may start  to feel some withdrawal symptoms. These symptoms are usually most noticeable 2-3 days after quitting, but they usually do not last beyond 2-3 weeks. Changes or symptoms that you might experience include:  Mood swings.  Restlessness, anxiety, or irritation.  Difficulty concentrating.  Dizziness.  Strong cravings for sugary foods in addition to nicotine.  Mild weight gain.  Constipation.  Nausea.  Coughing or a sore throat.  Changes in how your medicines work in your body.  A depressed mood.  Difficulty sleeping (insomnia). After the first 2-3 weeks of quitting, you may start to notice more positive results, such as:  Improved sense of smell and taste.  Decreased coughing and sore throat.  Slower heart rate.  Lower blood pressure.  Clearer skin.  The ability to breathe more easily.  Fewer sick days. Quitting smoking is very challenging for most people. Do not get discouraged if you are not successful the first time. Some people need to make many attempts to quit before they achieve long-term success. Do your best to stick to your quit plan, and talk with your health care provider if you have any questions or concerns.  This information is not intended to replace advice given to you by your health care provider. Make sure you discuss any questions you have with your health care provider.  Document Released: 12/15/2000 Document Revised: 05/07/2014 Document Reviewed: 05/07/2014  Elsevier Interactive Patient Education [employer]

## 2015-06-06 NOTE — ED Notes (Signed)
States she developed some fever a few days ago  Now having some diff breathing  With some wheezing  Worse at night

## 2015-06-06 NOTE — ED Provider Notes (Signed)
EKG interpreted by me Sinus tachycardia rate 105, normal axis and intervals. Normal QRS ST segments and T waves.  Sharman CheekPhillip Michale Weikel, MD 06/06/15 [phone]

## 2016-02-04 ENCOUNTER — Encounter: Payer: Self-pay | Admitting: Emergency Medicine

## 2016-02-04 ENCOUNTER — Emergency Department
Admission: EM | Admit: 2016-02-04 | Discharge: 2016-02-04 | Disposition: A | Discharge status: Home / Self Care | Payer: Managed Care, Other (non HMO) | Attending: Emergency Medicine | Admitting: Emergency Medicine

## 2016-02-04 ENCOUNTER — Emergency Department: Payer: Managed Care, Other (non HMO)

## 2016-02-04 DIAGNOSIS — Z79899 Other long term (current) drug therapy: Secondary | ICD-10-CM | POA: Insufficient documentation

## 2016-02-04 DIAGNOSIS — J42 Unspecified chronic bronchitis: Secondary | ICD-10-CM

## 2016-02-04 DIAGNOSIS — J441 Chronic obstructive pulmonary disease with (acute) exacerbation: Secondary | ICD-10-CM | POA: Diagnosis not present

## 2016-02-04 DIAGNOSIS — R0602 Shortness of breath: Secondary | ICD-10-CM | POA: Diagnosis present

## 2016-02-04 DIAGNOSIS — F17210 Nicotine dependence, cigarettes, uncomplicated: Secondary | ICD-10-CM | POA: Insufficient documentation

## 2016-02-04 DIAGNOSIS — J209 Acute bronchitis, unspecified: Secondary | ICD-10-CM

## 2016-02-04 LAB — BASIC METABOLIC PANEL
Anion gap: 7 (ref 5–15)
BUN: 10 mg/dL (ref 6–20)
CO2: 30 mmol/L (ref 22–32)
CREATININE: 0.82 mg/dL (ref 0.44–1.00)
Calcium: 9.4 mg/dL (ref 8.9–10.3)
Chloride: 104 mmol/L (ref 101–111)
GFR calc non Af Amer: >60 mL/min (ref >60)
Glucose, Bld: 130 mg/dL — ABNORMAL HIGH (ref 65–99)
Potassium: 4 mmol/L (ref 3.5–5.1)
Sodium: 141 mmol/L (ref 135–145)

## 2016-02-04 LAB — CBC
HEMATOCRIT: 41.6 % (ref 35.0–47.0)
HEMOGLOBIN: 14.5 g/dL (ref 12.0–16.0)
MCH: 30.5 pg (ref 26.0–34.0)
MCHC: 34.8 g/dL (ref 32.0–36.0)
MCV: 87.7 fL (ref 80.0–100.0)
Platelets: 358 10*3/uL (ref 150–440)
RBC: 4.74 MIL/uL (ref 3.80–5.20)
RDW: 13.8 % (ref 11.5–14.5)
WBC: 8.3 10*3/uL (ref 3.6–11.0)

## 2016-02-04 MED ORDER — PREDNISONE 20 MG PO TABS: 60.0000 mg | ORAL_TABLET | Freq: Every day | ORAL | 0 refills | Status: DC

## 2016-02-04 MED ORDER — ALBUTEROL SULFATE HFA 108 (90 BASE) MCG/ACT IN AERS: INHALATION_SPRAY | RESPIRATORY_TRACT | 1 refills | Status: AC

## 2016-02-04 MED ORDER — ALBUTEROL SULFATE (2.5 MG/3ML) 0.083% IN NEBU
5.0000 mg | INHALATION_SOLUTION | Freq: Once | RESPIRATORY_TRACT | Status: AC
  Administered 2016-02-04: 5 mg via RESPIRATORY_TRACT
  Filled 2016-02-04: qty 6

## 2016-02-04 MED ORDER — HYDROCODONE-HOMATROPINE 5-1.5 MG/5ML PO SYRP: 5.0000 mL | ORAL_SOLUTION | Freq: Four times a day (QID) | ORAL | 0 refills | Status: DC | PRN

## 2016-02-04 NOTE — ED Triage Notes (Signed)
Pt in via POV with complaints of increasing shortness of breath over the last few days.  Pt reports using inhalers at home without relief.  NAD noted at this time.

## 2016-02-04 NOTE — ED Notes (Signed)
Pt refuses d/c VS  

## 2016-02-04 NOTE — ED Notes (Signed)
Pt returned from US

## 2016-02-04 NOTE — ED Provider Notes (Signed)
[address] Emergency Department Provider Note  ____________________________________________   None    (approximate)  I have reviewed the triage vital signs and the nursing notes.   HISTORY  Chief Complaint Shortness of Breath    HPI Vickie Hamilton is a 51 y.o. female with a history of decades of tobacco use who presents for evaluation of gradually worsening cough and shortness of breath over the last few days.  She is using an albuterol inhaler at home and Tessalon but it is not helping.  She last used prednisone about 2 or 3 weeks ago.  She denies chest pain, nausea, vomiting, fever/chills, abdominal pain.  She states that her symptoms are at least moderate and sometimes severe.  She reports that she has been "dealing with this mess in my chest" for about a year.  She reports that the shortness of breath or get better for a while and then they will get worse again.  Her primary care doctor scheduled an appointment in 6 days [place]  She has no official diagnosis of COPD but has been smoking for many years.   History reviewed. No pertinent past medical history.  There are no active problems to display for this patient.   Past Surgical History:  Procedure Laterality Date  . CHOLECYSTECTOMY  2001    Prior to Admission medications   Medication Sig Start Date End Date Taking? Authorizing Provider  albuterol (PROVENTIL HFA;VENTOLIN HFA) 108 (90 Base) MCG/ACT inhaler Inhale 4-6 puffs by mouth every 4 hours as needed for wheezing, cough, and/or shortness of breath 02/04/16   Loleta Roseory Alben Jepsen, MD  amoxicillin-clavulanate (AUGMENTIN) 875-125 MG tablet Take 1 tablet by mouth 2 (two) times daily. 06/06/15   Jami L Hagler, PA-C  chlorpheniramine-HYDROcodone (TUSSIONEX PENNKINETIC ER) 10-8 MG/5ML SUER Take 5 mLs by mouth 2 (two) times daily. 01/02/15   Joni Reiningonald K Smith, PA-C  HYDROcodone-homatropine [facility](HYCODAN) 5-1.5 MG/5ML syrup Take 5  mLs by mouth every 6 (six) hours as needed for cough. 02/04/16   Loleta Roseory Lauralye Kinn, MD  methylPREDNISolone (MEDROL DOSEPAK) 4 MG TBPK tablet Take Tapered dose as directed 01/02/15   Joni Reiningonald K Smith, PA-C  predniSONE (DELTASONE) 20 MG tablet Take 3 tablets (60 mg total) by mouth daily. 02/04/16   Loleta Roseory Abrey Bradway, MD  promethazine (PHENERGAN) 12.5 MG tablet Take 1 tablet (12.5 mg total) by mouth every 6 (six) hours as needed for nausea or vomiting. 06/03/14   Minna AntisKevin Paduchowski, MD    Allergies Asa [aspirin] and Naproxen  No family history on file.  Social History Social History  Substance Use Topics  . Smoking status: Current Every Day Smoker    Packs/day: 0.50    Types: Cigarettes  . Smokeless tobacco: Never Used  . Alcohol use No     Comment: occassional    Review of Systems Constitutional: No fever/chills Eyes: No visual changes. ENT: No sore throat. Cardiovascular: Denies chest pain. Respiratory: Gradually worsening shortness of breath over the last few days with persistent cough Gastrointestinal: No abdominal pain.  No nausea, no vomiting.  No diarrhea.  No constipation. Genitourinary: Negative for dysuria. Musculoskeletal: Negative for back pain. Skin: Negative for rash. Neurological: Negative for headaches, focal weakness or numbness.  10-point ROS otherwise negative.  ____________________________________________   PHYSICAL EXAM:  VITAL SIGNS: ED Triage Vitals [02/04/16 1506]  Enc Vitals Group     BP (!) 177/95     Pulse Rate (!) 109     Resp (!)  21     Temp 98.2 F (36.8 C)     Temp Source Oral     SpO2 95 %     Weight 180 lb (81.6 kg)     Height 5\' 4"  (1.626 m)     Head Circumference      Peak Flow      Pain Score      Pain Loc      Pain Edu?      Excl. in GC?     Constitutional: Alert and oriented. Well appearing and in no acute distress. Eyes: Conjunctivae are normal. PERRL. EOMI. Head: Atraumatic. Nose: No congestion/rhinnorhea. Mouth/Throat: Mucous  membranes are moist.  Oropharynx non-erythematous. Neck: No stridor.  No meningeal signs.   Cardiovascular: Normal rate, regular rhythm. Good peripheral circulation. Grossly normal heart sounds. Respiratory: Normal respiratory effort.  No retractions. Mild expiratory wheezing throughout with no rales or rhonchi Gastrointestinal: Soft and nontender. No distention.  Musculoskeletal: No lower extremity tenderness nor edema. No gross deformities of extremities. Neurologic:  Normal speech and language. No gross focal neurologic deficits are appreciated.  Skin:  Skin is warm, dry and intact. No rash noted. Psychiatric: Mood and affect are normal. Speech and behavior are normal.  ____________________________________________   LABS (all labs ordered are listed, but only abnormal results are displayed)  Labs Reviewed  BASIC METABOLIC PANEL - Abnormal; Notable for the following:       Result Value   Glucose, Bld 130 (*)    All other components within normal limits  CBC   ____________________________________________  EKG  ED ECG REPORT I, Ravenne Wayment, the attending physician, personally viewed and interpreted this ECG.  Date: 02/04/2016 EKG Time: 15:15 Rate: 101 Rhythm: Borderline sinus tachycardia QRS Axis: normal Intervals: normal ST/T Wave abnormalities: normal Conduction Disturbances: none Narrative Interpretation: unremarkable  ____________________________________________  RADIOLOGY   Dg Chest 2 View  Result Date: 02/04/2016 CLINICAL DATA:  Shortness of breath. EXAM: CHEST  2 VIEW COMPARISON:  Radiographs of June 06, 2015. FINDINGS: The heart size and mediastinal contours are within normal limits. Both lungs are clear. No pneumothorax or pleural effusion is noted. The visualized skeletal structures are unremarkable. IMPRESSION: No active cardiopulmonary disease. Electronically Signed   By: Lupita Raider, M.D.   On: 02/04/2016 16:03     ____________________________________________   PROCEDURES  Procedure(s) performed:   Procedures   Critical Care performed: No ____________________________________________   INITIAL IMPRESSION / ASSESSMENT AND PLAN / ED COURSE  Pertinent labs & imaging results that were available during my care of the patient were reviewed by me and considered in my medical decision making (see chart for details).  The patient is generally well-appearing and is ambulating to and from the bathroom in the emergency department without any difficulty.  Her vital signs are essentially normal except for a mild tachycardia and a slightly increased respiratory rate when she was triaged but that has improved while sitting in the exam room.  She did get a DuoNeb in triage but feels like nothing helps.  Her oxygen saturations in the mid to upper 90s.  Her labs are unremarkable and her chest x-ray shows no acute disease.  I explained to her that this seems to be an exacerbation of her ongoing lung problems and she became quite upset at me for not being able to fix the problem.  I explained to her that I would treat her symptoms with prednisone and some prescription cough medicine, but I explained that the pulmonologist  that she is going to see in less than a week is the one most likely to be able to help her in the long-term.  I explained that there is no evidence of acute or emergent medical condition at this time.  She felt like her visit today was a waste of time and she stated that she would leave in a couple of minutes if I did not have her paperwork ready, so I provided the prescriptions as described above and got her paperwork ready.  She did not want any additional treatment in the emergency department.  She left ambulating without any difficulty and in no acute respiratory distress.      ____________________________________________  FINAL CLINICAL IMPRESSION(S) / ED DIAGNOSES  Final diagnoses:   Chronic bronchitis with acute exacerbation (HCC)     MEDICATIONS GIVEN DURING THIS VISIT:  Medications  albuterol (PROVENTIL) (2.5 MG/3ML) 0.083% nebulizer solution 5 mg (5 mg Nebulization Given 02/04/16 1516)     NEW OUTPATIENT MEDICATIONS STARTED DURING THIS VISIT:  Discharge Medication List as of 02/04/2016  6:27 PM      Discharge Medication List as of 02/04/2016  6:27 PM    CONTINUE these medications which have CHANGED   Details  albuterol (PROVENTIL HFA;VENTOLIN HFA) 108 (90 Base) MCG/ACT inhaler Inhale 4-6 puffs by mouth every 4 hours as needed for wheezing, cough, and/or shortness of breath, Print    HYDROcodone-homatropine (HYCODAN) 5-1.5 MG/5ML syrup Take 5 mLs by mouth every 6 (six) hours as needed for cough., Starting Wed 02/04/2016, Print    predniSONE (DELTASONE) 20 MG tablet Take 3 tablets (60 mg total) by mouth daily., Starting Wed 02/04/2016, Print        Discharge Medication List as of 02/04/2016  6:27 PM       Note:  This document was prepared using Dragon voice recognition software and may include unintentional dictation errors.    Loleta Rose, MD 02/04/16 [id number]

## 2016-02-04 NOTE — Discharge Instructions (Signed)
We believe that your symptoms are caused today by an exacerbation of your chronic bronchitis.  Please take the prescribed medications and any regular medications at home.  Follow up with your doctor as recommended.  If you develop any new or worsening symptoms, including but not limited to fever, persistent vomiting, worsening shortness of breath, or other symptoms that concern you, please return to the Emergency Department immediately.

## 2017-06-16 ENCOUNTER — Emergency Department: Payer: Managed Care, Other (non HMO)

## 2017-06-16 ENCOUNTER — Other Ambulatory Visit: Payer: Self-pay

## 2017-06-16 ENCOUNTER — Encounter: Payer: Self-pay | Admitting: Intensive Care

## 2017-06-16 ENCOUNTER — Emergency Department
Admission: EM | Admit: 2017-06-16 | Discharge: 2017-06-16 | Disposition: A | Discharge status: Home / Self Care | Payer: Managed Care, Other (non HMO) | Attending: Emergency Medicine | Admitting: Emergency Medicine

## 2017-06-16 DIAGNOSIS — Z79899 Other long term (current) drug therapy: Secondary | ICD-10-CM | POA: Insufficient documentation

## 2017-06-16 DIAGNOSIS — R911 Solitary pulmonary nodule: Secondary | ICD-10-CM | POA: Insufficient documentation

## 2017-06-16 DIAGNOSIS — Z9049 Acquired absence of other specified parts of digestive tract: Secondary | ICD-10-CM | POA: Diagnosis not present

## 2017-06-16 DIAGNOSIS — J45909 Unspecified asthma, uncomplicated: Secondary | ICD-10-CM | POA: Insufficient documentation

## 2017-06-16 DIAGNOSIS — F17210 Nicotine dependence, cigarettes, uncomplicated: Secondary | ICD-10-CM | POA: Diagnosis not present

## 2017-06-16 DIAGNOSIS — R109 Unspecified abdominal pain: Secondary | ICD-10-CM | POA: Diagnosis present

## 2017-06-16 DIAGNOSIS — R1031 Right lower quadrant pain: Secondary | ICD-10-CM | POA: Insufficient documentation

## 2017-06-16 HISTORY — DX: Unspecified asthma, uncomplicated: J45.909

## 2017-06-16 LAB — COMPREHENSIVE METABOLIC PANEL
ALT: 18 U/L (ref 14–54)
ANION GAP: 11 (ref 5–15)
AST: 22 U/L (ref 15–41)
Albumin: 4.3 g/dL (ref 3.5–5.0)
Alkaline Phosphatase: 78 U/L (ref 38–126)
BILIRUBIN TOTAL: 0.7 mg/dL (ref 0.3–1.2)
BUN: 14 mg/dL (ref 6–20)
CHLORIDE: 106 mmol/L (ref 101–111)
CO2: 26 mmol/L (ref 22–32)
Calcium: 9.5 mg/dL (ref 8.9–10.3)
Creatinine, Ser: 0.7 mg/dL (ref 0.44–1.00)
Glucose, Bld: 124 mg/dL — ABNORMAL HIGH (ref 65–99)
POTASSIUM: 3.5 mmol/L (ref 3.5–5.1)
Sodium: 143 mmol/L (ref 135–145)
TOTAL PROTEIN: 7.6 g/dL (ref 6.5–8.1)

## 2017-06-16 LAB — CBC
HEMATOCRIT: 43.3 % (ref 35.0–47.0)
Hemoglobin: 15.2 g/dL (ref 12.0–16.0)
MCH: 31.1 pg (ref 26.0–34.0)
MCHC: 35.1 g/dL (ref 32.0–36.0)
MCV: 88.5 fL (ref 80.0–100.0)
Platelets: 382 10*3/uL (ref 150–440)
RBC: 4.88 MIL/uL (ref 3.80–5.20)
RDW: 13.9 % (ref 11.5–14.5)
WBC: 8 10*3/uL (ref 3.6–11.0)

## 2017-06-16 LAB — URINALYSIS, COMPLETE (UACMP) WITH MICROSCOPIC
BACTERIA UA: NONE SEEN
BILIRUBIN URINE: NEGATIVE
GLUCOSE, UA: NEGATIVE mg/dL
HGB URINE DIPSTICK: NEGATIVE
Ketones, ur: NEGATIVE mg/dL
LEUKOCYTES UA: NEGATIVE
NITRITE: NEGATIVE
PROTEIN: NEGATIVE mg/dL
Specific Gravity, Urine: 1.017 (ref 1.005–1.030)
pH: 5 (ref 5.0–8.0)

## 2017-06-16 LAB — LIPASE, BLOOD: Lipase: 28 U/L (ref 11–51)

## 2017-06-16 MED ORDER — ONDANSETRON HCL 4 MG/2ML IJ SOLN
4.0000 mg | Freq: Once | INTRAMUSCULAR | Status: AC
  Administered 2017-06-16: 4 mg via INTRAVENOUS
  Filled 2017-06-16: qty 2

## 2017-06-16 MED ORDER — IOPAMIDOL (ISOVUE-300) INJECTION 61%
100.0000 mL | Freq: Once | INTRAVENOUS | Status: AC | PRN
  Administered 2017-06-16: 100 mL via INTRAVENOUS
  Filled 2017-06-16: qty 100

## 2017-06-16 MED ORDER — SODIUM CHLORIDE 0.9 % IV BOLUS
1000.0000 mL | Freq: Once | INTRAVENOUS | Status: AC
  Administered 2017-06-16: 1000 mL via INTRAVENOUS

## 2017-06-16 MED ORDER — FENTANYL CITRATE (PF) 100 MCG/2ML IJ SOLN
50.0000 ug | Freq: Once | INTRAMUSCULAR | Status: AC
  Administered 2017-06-16: 50 ug via INTRAVENOUS
  Filled 2017-06-16: qty 2

## 2017-06-16 NOTE — ED Notes (Signed)
Pt c/o RLQ pain since Saturday with nausea. Denies vomiting or diarrhea. Last BM earlier today..Marland Kitchen

## 2017-06-16 NOTE — ED Provider Notes (Signed)
[facility] Emergency Department Provider Note  ____________________________________________  Time seen: Approximately 2:34 PM  I have reviewed the triage vital signs and the nursing notes.   HISTORY  Chief Complaint Abdominal Pain   HPI Vickie Hamilton is a 52 y.o. female with a history of asthma who presents for evaluation of abdominal pain.  Patient reports 3 days of constant dull and intermittent sharp pain on the right side of her abdomen.  The pain is constant and nonradiating.  Currently moderate in intensity.  She has had nausea associated with it but no fever, chills, vomiting, diarrhea, constipation, dysuria or hematuria.  Patient no longer has menstrual periods.  She had a cholecystectomy several years ago, no other abdominal surgeries.  No chest pain or shortness of breath.  Past Medical History:  Diagnosis Date  . Asthma     There are no active problems to display for this patient.   Past Surgical History:  Procedure Laterality Date  . CHOLECYSTECTOMY  2001    Prior to Admission medications   Medication Sig Start Date End Date Taking? Authorizing Provider  albuterol (PROVENTIL HFA;VENTOLIN HFA) 108 (90 Base) MCG/ACT inhaler Inhale 4-6 puffs by mouth every 4 hours as needed for wheezing, cough, and/or shortness of breath 02/04/16   Loleta Rose, MD  amoxicillin-clavulanate (AUGMENTIN) 875-125 MG tablet Take 1 tablet by mouth 2 (two) times daily. 06/06/15   Hagler, Jami L, PA-C  chlorpheniramine-HYDROcodone (TUSSIONEX PENNKINETIC ER) 10-8 MG/5ML SUER Take 5 mLs by mouth 2 (two) times daily. 01/02/15   Joni Reining, PA-C  HYDROcodone-homatropine [facility]) 5-1.5 MG/5ML syrup Take 5 mLs by mouth every 6 (six) hours as needed for cough. 02/04/16   Loleta Rose, MD  methylPREDNISolone (MEDROL DOSEPAK) 4 MG TBPK tablet Take Tapered dose as directed 01/02/15   Joni Reining, PA-C  predniSONE (DELTASONE) 20 MG tablet Take 3 tablets (60 mg total)  by mouth daily. 02/04/16   Loleta Rose, MD  promethazine (PHENERGAN) 12.5 MG tablet Take 1 tablet (12.5 mg total) by mouth every 6 (six) hours as needed for nausea or vomiting. 06/03/14   Minna Antis, MD    Allergies Asa [aspirin] and Naproxen  History reviewed. No pertinent family history.  Social History Social History   Tobacco Use  . Smoking status: Current Every Day Smoker    Packs/day: 0.50    Types: Cigarettes  . Smokeless tobacco: Never Used  Substance Use Topics  . Alcohol use: No    Comment: occassional  . Drug use: No    Review of Systems  Constitutional: Negative for fever. Eyes: Negative for visual changes. ENT: Negative for sore throat. Neck: No neck pain  Cardiovascular: Negative for chest pain. Respiratory: Negative for shortness of breath. Gastrointestinal: + abdominal pain and nausea. No vomiting or diarrhea. Genitourinary: Negative for dysuria. Musculoskeletal: Negative for back pain. Skin: Negative for rash. Neurological: Negative for headaches, weakness or numbness. Psych: No SI or HI  ____________________________________________   PHYSICAL EXAM:  VITAL SIGNS: ED Triage Vitals  Enc Vitals Group     BP 06/16/17 1329 139/84     Pulse Rate 06/16/17 1329 (!) 107     Resp 06/16/17 1329 14     Temp 06/16/17 1329 98.2 F (36.8 C)     Temp src --      SpO2 06/16/17 1329 96 %     Weight 06/16/17 1329 168 lb (76.2 kg)     Height 06/16/17 1329 5\' 4"  (1.626 m)  Head Circumference --      Peak Flow --      Pain Score 06/16/17 1354 8     Pain Loc --      Pain Edu? --      Excl. in GC? --     Constitutional: Alert and oriented. Well appearing and in no apparent distress. HEENT:      Head: Normocephalic and atraumatic.         Eyes: Conjunctivae are normal. Sclera is non-icteric.       Mouth/Throat: Mucous membranes are moist.       Neck: Supple with no signs of meningismus. Cardiovascular: Regular rate and rhythm. No murmurs,  gallops, or rubs. 2+ symmetrical distal pulses are present in all extremities. No JVD. Respiratory: Normal respiratory effort. Lungs are clear to auscultation bilaterally. No wheezes, crackles, or rhonchi.  Gastrointestinal: Soft, ttp over the RLQ, and non distended with positive bowel sounds. No rebound or guarding. Genitourinary: No CVA tenderness. Musculoskeletal: Nontender with normal range of motion in all extremities. No edema, cyanosis, or erythema of extremities. Neurologic: Normal speech and language. Face is symmetric. Moving all extremities. No gross focal neurologic deficits are appreciated. Skin: Skin is warm, dry and intact. No rash noted. Psychiatric: Mood and affect are normal. Speech and behavior are normal.  ____________________________________________   LABS (all labs ordered are listed, but only abnormal results are displayed)  Labs Reviewed  COMPREHENSIVE METABOLIC PANEL - Abnormal; Notable for the following components:      Result Value   Glucose, Bld 124 (*)    All other components within normal limits  URINALYSIS, COMPLETE (UACMP) WITH MICROSCOPIC - Abnormal; Notable for the following components:   Color, Urine YELLOW (*)    APPearance CLEAR (*)    All other components within normal limits  LIPASE, BLOOD  CBC   ____________________________________________  EKG  none  ____________________________________________  RADIOLOGY  I have personally reviewed the images performed during this visit and I agree with the Radiologist's read.   Interpretation by Radiologist:  Ct Abdomen Pelvis W Contrast  Result Date: 06/16/2017 CLINICAL DATA:  Right lower quadrant pain since Saturday with nausea. EXAM: CT ABDOMEN AND PELVIS WITH CONTRAST TECHNIQUE: Multidetector CT imaging of the abdomen and pelvis was performed using the standard protocol following bolus administration of intravenous contrast. CONTRAST:  ISOVUE-300 IOPAMIDOL (ISOVUE-300) INJECTION 61%  COMPARISON:  Plain films 06/03/2014. 01/22/2000 abdominopelvic CT report. Images not available. FINDINGS: Lower chest: Mild volume loss and scarring at the anterior right lung base. 5 mm right lower lobe pulmonary nodule on image 10/4. Normal heart size without pericardial or pleural effusion. Hepatobiliary: Normal liver. Cholecystectomy, without biliary ductal dilatation. Pancreas: Normal, without mass or ductal dilatation. Spleen: Normal in size, without focal abnormality. Adrenals/Urinary Tract: Normal adrenal glands. Normal kidneys, without hydronephrosis. Normal urinary bladder. Stomach/Bowel: Normal stomach, without wall thickening. Normal colon and terminal ileum. Normal appendix, including on image 62/2. Normal small bowel. Vascular/Lymphatic: Advanced aortic and branch vessel atherosclerosis. No abdominopelvic adenopathy. Reproductive: Normal uterus and adnexa. Other: No significant free fluid.  No free intraperitoneal air. Musculoskeletal: Probable bone island in the left iliac. Disc bulges at L4-5 and L3-4. IMPRESSION: 1. No acute process in the abdomen or pelvis. Normal appendix, without explanation for right lower quadrant pain. 2. Aortic Atherosclerosis (ICD10-I70.0). This is mildly age advanced. 3. Right lung base nodule of 5 mm. No follow-up needed if patient is low-risk. Non-contrast chest CT can be considered in 12 months if patient is high-risk.  This recommendation follows the consensus statement: Guidelines for Management of Incidental Pulmonary Nodules Detected on CT Images: From the Fleischner Society 2017; Radiology 2017; 284:228-243. Electronically Signed   By: Jeronimo GreavesKyle  Talbot M.D.   On: 06/16/2017 15:36      ____________________________________________   PROCEDURES  Procedure(s) performed: None Procedures Critical Care performed:  None ____________________________________________   INITIAL IMPRESSION / ASSESSMENT AND PLAN / ED COURSE   52 y.o. female with a history of asthma  who presents for evaluation of right sided abdominal pain x 3 days associated with nausea.  Patient is well-appearing, no distress, slightly tachycardic with a pulse of 107 but normotensive, afebrile, abdomen is soft with tenderness to palpation on the right lower quadrant, no rebound or guarding.  Differential diagnoses including appendicitis versus diverticulitis versus colitis versus SBO versus versus kidney stone versus pyelonephritis.  Less likely ovarian in post menopausal female. Plan for CBC, CMP, lipase, urinalysis, and CT abdomen pelvis.  We will give fentanyl and Zofran for symptoms.    _________________________ 4:27 PM on 06/16/2017 -----------------------------------------  Labs, urinalysis, and CT abdomen pelvis with no acute findings.  Patient's serial abdominal exam show resolution of the pain after receiving fentanyl.  Recommended follow-up in 12-24 hours with primary care doctor or back in the ED if the pain recurs.  Low suspicion for ovarian pathology and postmenopausal female with normal adnexa seen on CT scan.  Also discussed with the patient incidental finding of a pulmonary nodule and recommended a 1784-month follow-up.  Discussed return precautions with patient.   As part of my medical decision making, I reviewed the following data within the electronic MEDICAL RECORD NUMBER Nursing notes reviewed and incorporated, Labs reviewed , Old chart reviewed, Radiograph reviewed , Notes from prior ED visits and [place] Controlled Substance Database    Pertinent labs & imaging results that were available during my care of the patient were reviewed by me and considered in my medical decision making (see chart for details).    ____________________________________________   FINAL CLINICAL IMPRESSION(S) / ED DIAGNOSES  Final diagnoses:  Right lower quadrant abdominal pain  Pulmonary nodule      NEW MEDICATIONS STARTED DURING THIS VISIT:  ED Discharge Orders    None       Note:   This document was prepared using Dragon voice recognition software and may include unintentional dictation errors.    Don PerkingVeronese, [place], MD 06/16/17 [phone]

## 2017-06-16 NOTE — ED Notes (Signed)
Pt given a warm blanket and lights dimmed for pt comfort.

## 2017-06-16 NOTE — Discharge Instructions (Addendum)
You have been seen in the Emergency Department (ED) for abdominal pain.  Your evaluation did not identify a clear cause of your symptoms but was generally reassuring.  Abdominal pain has many possible causes. Some aren't serious and get better on their own in a few days. Others need more testing and treatment. If your pain continues or gets worse, you need to be rechecked and may need more tests to find out what is wrong. You may need surgery to correct the problem.   Follow up with your doctor in 12-24 hours if you are still having abdominal pain. Otherwise follow up in 1-3 days for a re-check  You will also need follow up imaging of your chest in 1 year to follow up th nodule seen in your lung. Discuss this with your doctor.  Don't ignore new symptoms, such as fever, nausea and vomiting, new or worsening abdominal pain, urination problems, bloody diarrhea or bloody stools, black tarry stools, uncontrollable nausea and vomiting, and dizziness. These may be signs of a more serious problem. If you develop any of these you should be seen by your doctor immediately or return to the ED.   How can you care for yourself at home?  Rest until you feel better.  To prevent dehydration, drink plenty of fluids, enough so that your urine is light yellow or clear like water. Choose water and other caffeine-free clear liquids until you feel better. If you have kidney, heart, or liver disease and have to limit fluids, talk with your doctor before you increase the amount of fluids you drink.  If your stomach is upset, eat mild foods, such as rice, dry toast or crackers, bananas, and applesauce. Try eating several small meals instead of two or three large ones.  Wait until 48 hours after all symptoms have gone away before you have spicy foods, alcohol, and drinks that contain caffeine.  Do not eat foods that are high in fat.  Avoid anti-inflammatory medicines such as aspirin, ibuprofen (Advil, Motrin), and naproxen  (Aleve). These can cause stomach upset. Talk to your doctor if you take daily aspirin for another health problem.  When should you call for help?  Call 911 anytime you think you may need emergency care. For example, call if:  You passed out (lost consciousness).  You pass maroon or very bloody stools.  You vomit blood or what looks like coffee grounds.  You have new, severe belly pain.  Call your doctor now or seek immediate medical care if:  Your pain gets worse, especially if it becomes focused in one area of your belly.  You have a new or higher fever.  Your stools are black and look like tar, or they have streaks of blood.  You have unexpected vaginal bleeding.  You have symptoms of a urinary tract infection. These may include:  Pain when you urinate.  Urinating more often than usual.  Blood in your urine. You are dizzy or lightheaded, or you feel like you may faint. Watch closely for changes in your health, and be sure to contact your doctor if:  You are not getting better after 1 day (24 hours).

## 2017-06-16 NOTE — ED Triage Notes (Addendum)
Patient reports having R lower abdominal pain X3 days. Reports feeling nauseas. Denies abnormal d/c or bleeding

## 2017-06-16 NOTE — ED Notes (Signed)
Pt returned from CT °

## 2018-08-31 ENCOUNTER — Other Ambulatory Visit: Payer: Self-pay

## 2018-08-31 DIAGNOSIS — Z20822 Contact with and (suspected) exposure to covid-19: Secondary | ICD-10-CM

## 2018-09-01 LAB — NOVEL CORONAVIRUS, NAA: SARS-CoV-2, NAA: DETECTED — AB

## 2020-04-15 IMAGING — CT CT ABD-PELV W/ CM
2 of 5 series · 16 of 46 positions shown, 18 images · IV contrast (APPLIED)
Comparison: Plain films 06/03/2014. 01/22/2000 abdominopelvic CT
report. Images not available.

CLINICAL DATA: Right lower quadrant pain since [REDACTED] with
nausea.

EXAM:
CT ABDOMEN AND PELVIS WITH CONTRAST
TECHNIQUE: Multidetector CT imaging of the abdomen and pelvis was performed
using the standard protocol following bolus administration of
intravenous contrast.
CONTRAST:  100mL [id number] IOPAMIDOL (NB7SBR-F55) INJECTION 61%

[Series 2: routine abd/pel with · axial · 0.77mm/px · z∈[-1105,-685]mm · 13 of 96 slices shown, 15 images]
[im 6/96  soft-tissue]
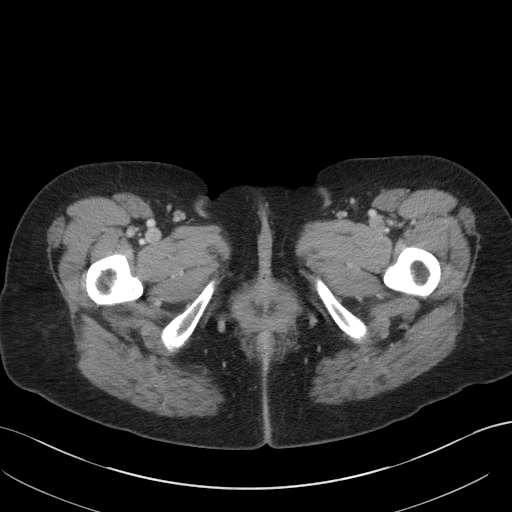
[im 6/96  bone]
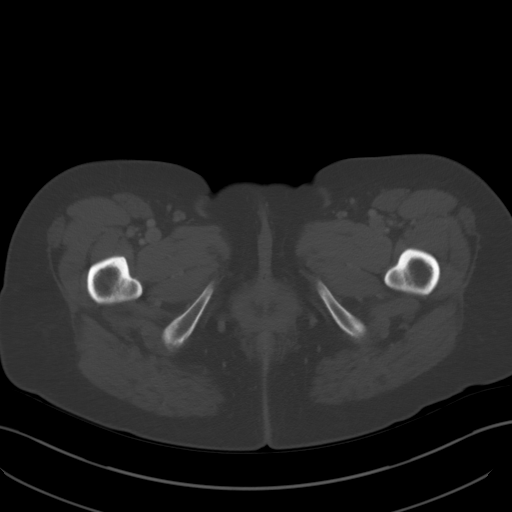
[im 11/96  soft-tissue]
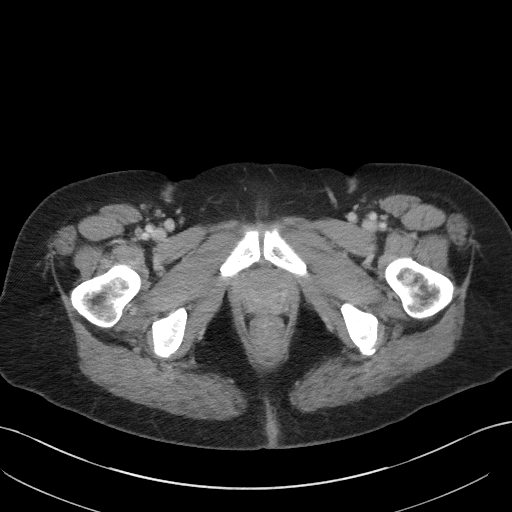
[im 22/96  soft-tissue]
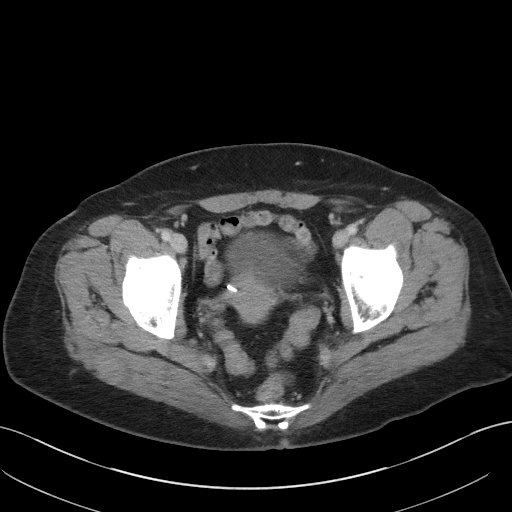
[im 27/96  soft-tissue]
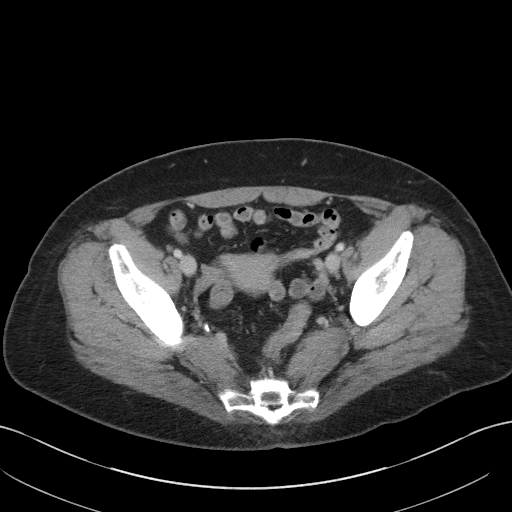
[im 32/96  soft-tissue]
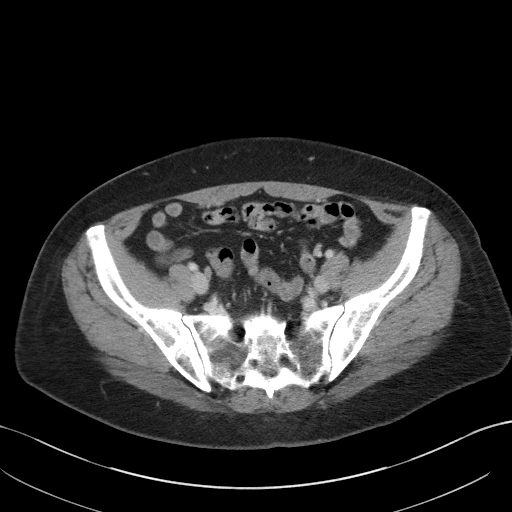
[im 43/96  soft-tissue]
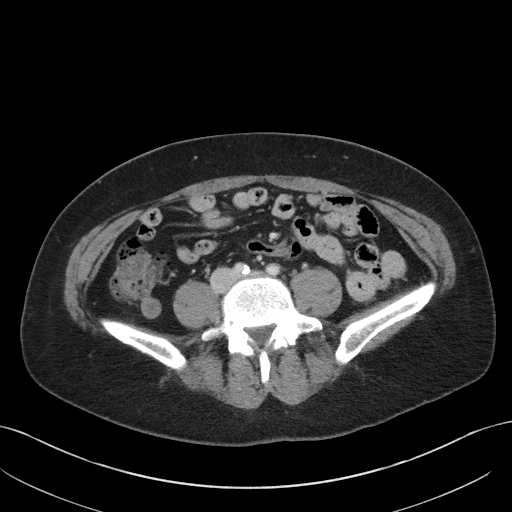
[im 48/96  soft-tissue]
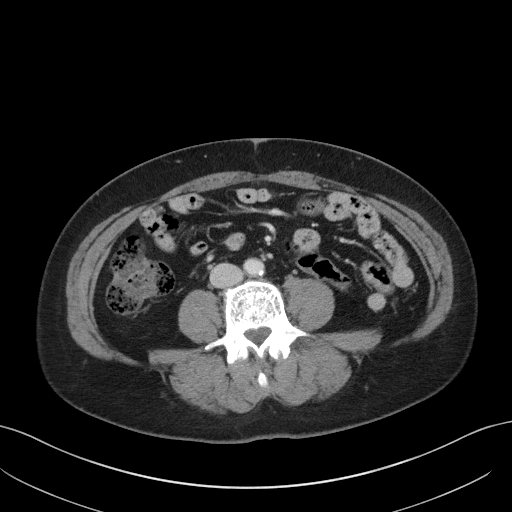
[im 53/96  soft-tissue]
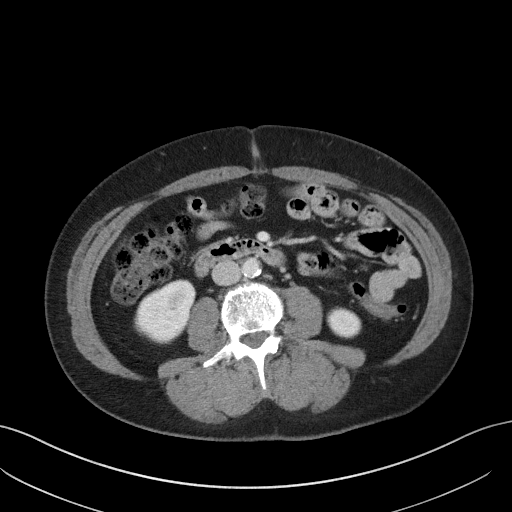
[im 64/96  soft-tissue]
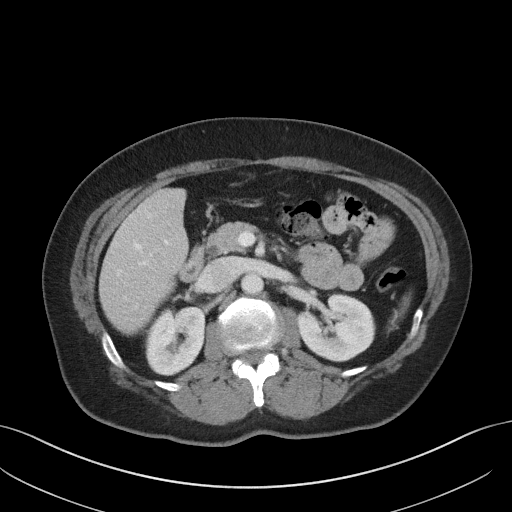
[im 64/96  bone]
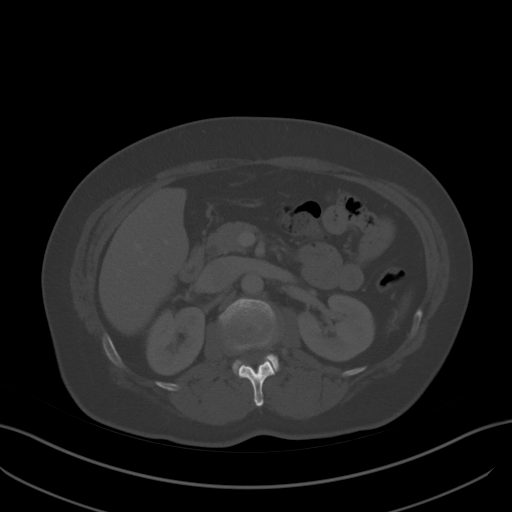
[im 69/96  soft-tissue]
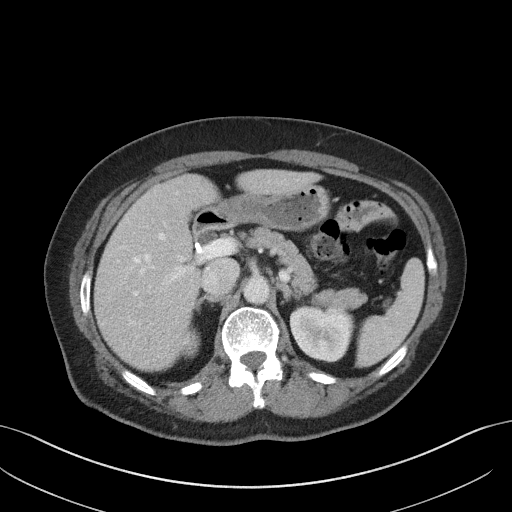
[im 74/96  soft-tissue]
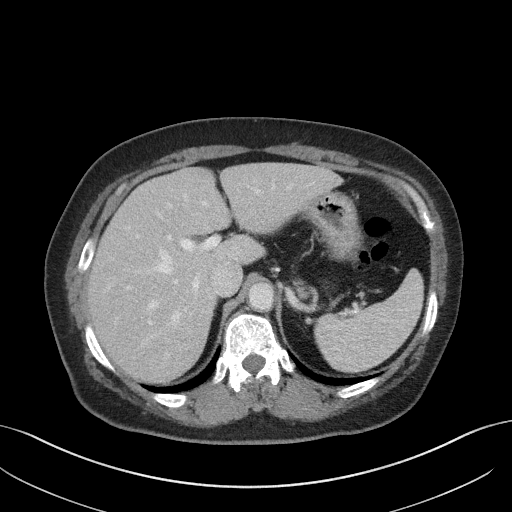
[im 85/96  soft-tissue]
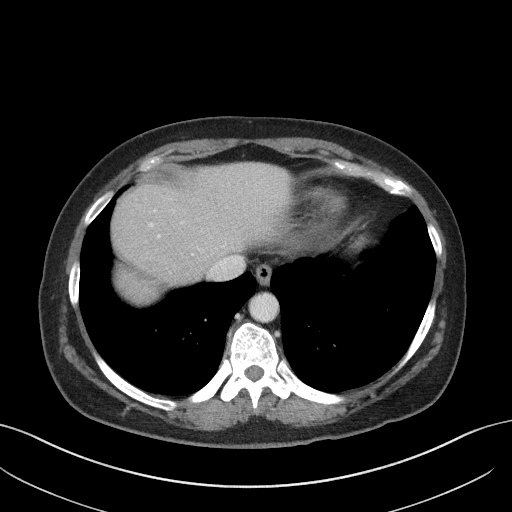
[im 90/96  soft-tissue]
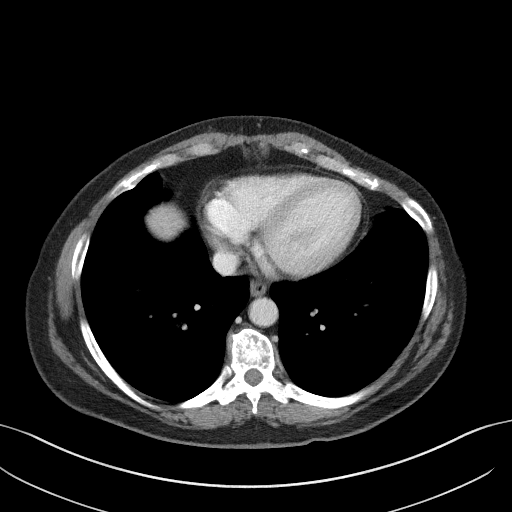

[Series 5: coronal st · coronal · 0.82mm/px · 3 of 79 slices shown]
[im 27/79  soft-tissue]
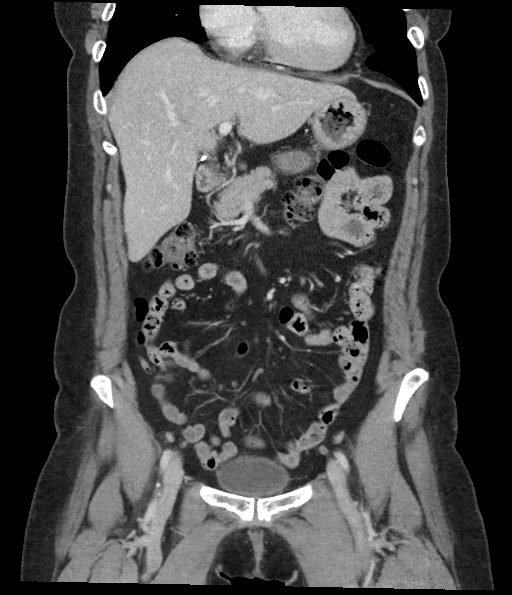
[im 35/79  soft-tissue]
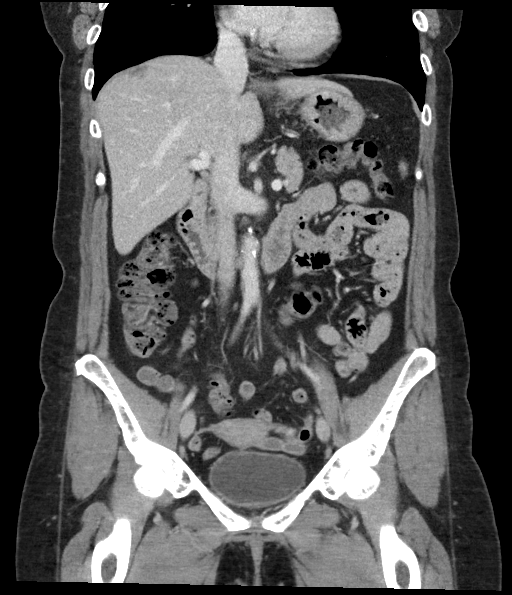
[im 44/79  soft-tissue]
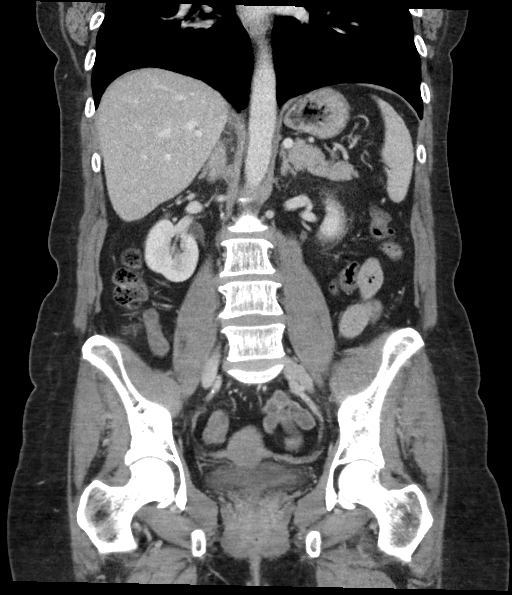

[16 of 46 positions shown; findings below may reference images not displayed]

FINDINGS: Lower chest: Mild volume loss and scarring at the anterior right
lung base. 5 mm right lower lobe pulmonary nodule on image [DATE].
Normal heart size without pericardial or pleural effusion.

Hepatobiliary: Normal liver. Cholecystectomy, without biliary ductal
dilatation.

Pancreas: Normal, without mass or ductal dilatation.

Spleen: Normal in size, without focal abnormality.

Adrenals/Urinary Tract: Normal adrenal glands. Normal kidneys,
without hydronephrosis. Normal urinary bladder.

Stomach/Bowel: Normal stomach, without wall thickening. Normal colon
and terminal ileum. Normal appendix, including on image 62/2. Normal
small bowel.

Vascular/Lymphatic: Advanced aortic and branch vessel
atherosclerosis. No abdominopelvic adenopathy.

Reproductive: Normal uterus and adnexa.

Other: No significant free fluid.  No free intraperitoneal air.

Musculoskeletal: Probable bone island in the left iliac. Disc bulges
at L4-5 and L3-4.
IMPRESSION: 1. No acute process in the abdomen or pelvis. Normal appendix,
without explanation for right lower quadrant pain.
2. Aortic Atherosclerosis ([id number]). This is mildly age
advanced.
3. Right lung base nodule of 5 mm. No follow-up needed if patient is
low-risk. Non-contrast chest CT can be considered in 12 months if
patient is high-risk. This recommendation follows the consensus
statement: Guidelines for Management of Incidental Pulmonary Nodules
Detected on CT Images: From the [HOSPITAL] 7633; Radiology

## 2020-07-29 ENCOUNTER — Emergency Department
Admission: EM | Admit: 2020-07-29 | Discharge: 2020-07-29 | Disposition: A | Discharge status: Home / Self Care | Payer: BC Managed Care – PPO | Attending: Emergency Medicine | Admitting: Emergency Medicine

## 2020-07-29 ENCOUNTER — Emergency Department: Payer: BC Managed Care – PPO

## 2020-07-29 ENCOUNTER — Encounter: Payer: Self-pay | Admitting: Emergency Medicine

## 2020-07-29 ENCOUNTER — Other Ambulatory Visit: Payer: Self-pay

## 2020-07-29 DIAGNOSIS — F17210 Nicotine dependence, cigarettes, uncomplicated: Secondary | ICD-10-CM | POA: Insufficient documentation

## 2020-07-29 DIAGNOSIS — Z7951 Long term (current) use of inhaled steroids: Secondary | ICD-10-CM | POA: Insufficient documentation

## 2020-07-29 DIAGNOSIS — E876 Hypokalemia: Secondary | ICD-10-CM | POA: Diagnosis not present

## 2020-07-29 DIAGNOSIS — R059 Cough, unspecified: Secondary | ICD-10-CM | POA: Diagnosis present

## 2020-07-29 DIAGNOSIS — J45909 Unspecified asthma, uncomplicated: Secondary | ICD-10-CM | POA: Insufficient documentation

## 2020-07-29 DIAGNOSIS — R000 Tachycardia, unspecified: Secondary | ICD-10-CM | POA: Diagnosis not present

## 2020-07-29 DIAGNOSIS — U071 COVID-19: Secondary | ICD-10-CM | POA: Insufficient documentation

## 2020-07-29 LAB — URINALYSIS, COMPLETE (UACMP) WITH MICROSCOPIC
Bacteria, UA: NONE SEEN
Bilirubin Urine: NEGATIVE
Glucose, UA: NEGATIVE mg/dL
Ketones, ur: NEGATIVE mg/dL
Leukocytes,Ua: NEGATIVE
Nitrite: NEGATIVE
Protein, ur: NEGATIVE mg/dL
Specific Gravity, Urine: 1.024 (ref 1.005–1.030)
pH: 5 (ref 5.0–8.0)

## 2020-07-29 LAB — HEPATIC FUNCTION PANEL
ALT: 23 U/L (ref 0–44)
AST: 23 U/L (ref 15–41)
Albumin: 4.2 g/dL (ref 3.5–5.0)
Alkaline Phosphatase: 71 U/L (ref 38–126)
Bilirubin, Direct: <0.1 mg/dL (ref 0.0–0.2)
Total Bilirubin: 0.9 mg/dL (ref 0.3–1.2)
Total Protein: 7.6 g/dL (ref 6.5–8.1)

## 2020-07-29 LAB — CBC
HCT: 41 % (ref 36.0–46.0)
Hemoglobin: 13.9 g/dL (ref 12.0–15.0)
MCH: 29.3 pg (ref 26.0–34.0)
MCHC: 33.9 g/dL (ref 30.0–36.0)
MCV: 86.3 fL (ref 80.0–100.0)
Platelets: 302 10*3/uL (ref 150–400)
RBC: 4.75 MIL/uL (ref 3.87–5.11)
RDW: 13 % (ref 11.5–15.5)
WBC: 7 10*3/uL (ref 4.0–10.5)
nRBC: 0 % (ref 0.0–0.2)

## 2020-07-29 LAB — TROPONIN I (HIGH SENSITIVITY): Troponin I (High Sensitivity): 6 ng/L (ref <18)

## 2020-07-29 LAB — RESP PANEL BY RT-PCR (FLU A&B, COVID) ARPGX2
Influenza A by PCR: NEGATIVE
Influenza B by PCR: NEGATIVE
SARS Coronavirus 2 by RT PCR: POSITIVE — AB

## 2020-07-29 LAB — BASIC METABOLIC PANEL
Anion gap: 10 (ref 5–15)
BUN: 12 mg/dL (ref 6–20)
CO2: 26 mmol/L (ref 22–32)
Calcium: 9.2 mg/dL (ref 8.9–10.3)
Chloride: 102 mmol/L (ref 98–111)
Creatinine, Ser: 0.84 mg/dL (ref 0.44–1.00)
GFR, Estimated: >60 mL/min (ref >60)
Glucose, Bld: 121 mg/dL — ABNORMAL HIGH (ref 70–99)
Potassium: 3.2 mmol/L — ABNORMAL LOW (ref 3.5–5.1)
Sodium: 138 mmol/L (ref 135–145)

## 2020-07-29 MED ORDER — PREDNISONE 50 MG PO TABS: ORAL_TABLET | ORAL | 0 refills | Status: DC

## 2020-07-29 MED ORDER — ACETAMINOPHEN 500 MG PO TABS
1000.0000 mg | ORAL_TABLET | Freq: Once | ORAL | Status: AC
  Administered 2020-07-29: 1000 mg via ORAL
  Filled 2020-07-29: qty 2

## 2020-07-29 MED ORDER — ALBUTEROL SULFATE HFA 108 (90 BASE) MCG/ACT IN AERS
2.0000 | INHALATION_SPRAY | RESPIRATORY_TRACT | Status: DC | PRN
  Filled 2020-07-29: qty 6.7

## 2020-07-29 MED ORDER — BUDESONIDE-FORMOTEROL FUMARATE 80-4.5 MCG/ACT IN AERO: 2.0000 | INHALATION_SPRAY | Freq: Two times a day (BID) | RESPIRATORY_TRACT | 0 refills | Status: AC

## 2020-07-29 MED ORDER — SODIUM CHLORIDE 0.9 % IV BOLUS
1000.0000 mL | Freq: Once | INTRAVENOUS | Status: AC
  Administered 2020-07-29: 1000 mL via INTRAVENOUS

## 2020-07-29 MED ORDER — PAXLOVID 10 X 150 MG & 10 X 100MG PO TBPK: 2.0000 | ORAL_TABLET | Freq: Two times a day (BID) | ORAL | 0 refills | Status: AC

## 2020-07-29 MED ORDER — IPRATROPIUM-ALBUTEROL 0.5-2.5 (3) MG/3ML IN SOLN
3.0000 mL | Freq: Once | RESPIRATORY_TRACT | Status: AC
  Administered 2020-07-29: 3 mL via RESPIRATORY_TRACT
  Filled 2020-07-29: qty 3

## 2020-07-29 MED ORDER — PREDNISONE 20 MG PO TABS
40.0000 mg | ORAL_TABLET | Freq: Once | ORAL | Status: AC
  Administered 2020-07-29: 40 mg via ORAL
  Filled 2020-07-29: qty 2

## 2020-07-29 MED ORDER — ONDANSETRON 4 MG PO TBDP: 4.0000 mg | ORAL_TABLET | Freq: Four times a day (QID) | ORAL | 0 refills | Status: AC | PRN

## 2020-07-29 MED ORDER — ONDANSETRON 4 MG PO TBDP
4.0000 mg | ORAL_TABLET | Freq: Once | ORAL | Status: AC
  Administered 2020-07-29: 4 mg via ORAL
  Filled 2020-07-29: qty 1

## 2020-07-29 MED ORDER — POTASSIUM CHLORIDE CRYS ER 20 MEQ PO TBCR
40.0000 meq | EXTENDED_RELEASE_TABLET | Freq: Once | ORAL | Status: AC
  Administered 2020-07-29: 40 meq via ORAL
  Filled 2020-07-29: qty 2

## 2020-07-29 NOTE — ED Notes (Signed)
Patient's fluids were taken off so patient could go to the bathroom, apparently, and were not restarted. Fluids are now running in briskly.

## 2020-07-29 NOTE — ED Notes (Signed)
Patient taken to imaging. 

## 2020-07-29 NOTE — ED Provider Notes (Signed)
[facility] Emergency Department Provider Note ____________________________________________   Event Date/Time   First MD Initiated Contact with Patient 07/29/20 1254     (approximate)  I have reviewed the triage vital signs and the nursing notes.   HISTORY  Chief Complaint Weakness and Headache  HPI Vickie Hamilton is a 55 y.o. female has history of asthma, migraines  Patient reports that yesterday she had a bit of a very slight cough and had to use her rescue inhaler a couple times for slight wheezing.  She went to bed feeling well, and then woke up early this morning with chills feeling feverish off and on and body aches.  She then reports she start experiencing a little bit of a headache which is actually improved quite a bit now after receiving Tylenol.  She reports that she will get symptoms like this often is one of her first symptoms when she started to get ill with infections like a virus.  No abdominal pain no nausea vomiting but reports decreased appetite.  She reports she just feels a little bit "dehydrated".  Not have any shortness of breath at this second, but does report a very slight cough today.  Has a history of asthma.  Feels like her asthma may be mildly active.  Reports slight body aches no rash.  Does report a very mild headache no neck pain or stiffness no sore throat.  Denies any pain or burning with urination, but does report the symptoms she experiencing are usually symptoms to get when she is just started to get sick with some sort of an illness.  Past Medical History:  Diagnosis Date   Asthma     There are no problems to display for this patient.   Past Surgical History:  Procedure Laterality Date   CHOLECYSTECTOMY  2001    Prior to Admission medications   Medication Sig Start Date End Date Taking? Authorizing Provider  budesonide-formoterol (SYMBICORT) 80-4.5 MCG/ACT inhaler Inhale 2 puffs into the lungs 2 (two) times daily.  07/29/20  Yes Sharyn Creamer, MD  nirmatrelvir/ritonavir EUA, renal dosing, (PAXLOVID) TBPK Take 2 tablets by mouth 2 (two) times daily for 5 days. Patient GFR is 60. Take nirmatrelvir (150 mg) one tablet twice daily for 5 days and ritonavir (100 mg) one tablet twice daily for 5 days. 07/29/20 08/03/20 Yes Sharyn Creamer, MD  ondansetron (ZOFRAN ODT) 4 MG disintegrating tablet Take 1 tablet (4 mg total) by mouth every 6 (six) hours as needed for nausea or vomiting. 07/29/20  Yes Sharyn Creamer, MD  predniSONE (DELTASONE) 50 MG tablet 1 tab by mouth daily, start 07-29-2020 07/29/20  Yes Sharyn Creamer, MD  albuterol (PROVENTIL HFA;VENTOLIN HFA) 108 (90 Base) MCG/ACT inhaler Inhale 4-6 puffs by mouth every 4 hours as needed for wheezing, cough, and/or shortness of breath 02/04/16   Loleta Rose, MD  amoxicillin-clavulanate (AUGMENTIN) 875-125 MG tablet Take 1 tablet by mouth 2 (two) times daily. 06/06/15   Hagler, Jami L, PA-C  chlorpheniramine-HYDROcodone (TUSSIONEX PENNKINETIC ER) 10-8 MG/5ML SUER Take 5 mLs by mouth 2 (two) times daily. 01/02/15   Joni Reining, PA-C  HYDROcodone-homatropine [facility]) 5-1.5 MG/5ML syrup Take 5 mLs by mouth every 6 (six) hours as needed for cough. 02/04/16   Loleta Rose, MD  methylPREDNISolone (MEDROL DOSEPAK) 4 MG TBPK tablet Take Tapered dose as directed 01/02/15   Joni Reining, PA-C  predniSONE (DELTASONE) 20 MG tablet Take 3 tablets (60 mg total) by mouth daily. 02/04/16  Loleta Rose, MD  promethazine (PHENERGAN) 12.5 MG tablet Take 1 tablet (12.5 mg total) by mouth every 6 (six) hours as needed for nausea or vomiting. 06/03/14   Minna Antis, MD    Allergies Asa [aspirin] and Naproxen  History reviewed. No pertinent family history.  Social History Social History   Tobacco Use   Smoking status: Every Day    Packs/day: 0.50    Types: Cigarettes   Smokeless tobacco: Never  Substance Use Topics   Alcohol use: No    Comment: occassional   Drug use: No     Review of Systems Constitutional: Fevers chills slight body aches and some fatigue this morning.  Still able to get up walk [place] have any or much appetite today. Eyes: No visual changes. ENT: No sore throat.  No neck pain or stiffness. Cardiovascular: Denies chest pain. Respiratory: Denies shortness of breath.  Just a slight dry cough feels like she is starting to have early or mild asthma Gastrointestinal: No abdominal pain.  No nausea or vomiting.  Reports a slight decreased appetite Genitourinary: Negative for dysuria.  No abnormal urine odor. Musculoskeletal: Negative for back pain.  Little achy in all her muscles. Skin: Negative for rash. Neurological: Negative for areas of focal weakness or numbness.  Ports a very mild headache, reports she gets the same often if she gets a little bit dehydrated when she starts to get sick with general illnesses.  Headache is kind of all over slight throbbing component.  No associated eye pain    ____________________________________________   PHYSICAL EXAM:  VITAL SIGNS: ED Triage Vitals  Enc Vitals Group     BP 07/29/20 1108 136/86     Pulse Rate 07/29/20 1108 (!) 127     Resp 07/29/20 1108 18     Temp 07/29/20 1108 99.6 F (37.6 C)     Temp Source 07/29/20 1108 Oral     SpO2 07/29/20 1108 95 %     Weight 07/29/20 1109 180 lb (81.6 kg)     Height 07/29/20 1109 5\' 4"  (1.626 m)     Head Circumference --      Peak Flow --      Pain Score 07/29/20 1108 10     Pain Loc --      Pain Edu? --      Excl. in GC? --     Constitutional: Alert and oriented. Well appearing and in no acute distress though slightly fatigued, laying in the bed pleasant without notable distress. Eyes: Conjunctivae are normal. Head: Atraumatic. Nose: No congestion/rhinnorhea. Mouth/Throat: Mucous membranes are moist. Neck: No stridor.  No meningismus Cardiovascular: Minimally tachycardic about 105 rate, regular rhythm. Grossly normal heart sounds.  Good  peripheral circulation. Respiratory: Normal respiratory effort.  No retractions. Lungs CTAB.  Does demonstrate a slight dry cough with expiration.  No obvious wheezing no distress noted though. Gastrointestinal: Soft and nontender. No distention. Musculoskeletal: No lower extremity tenderness nor edema. Neurologic:  Normal speech and language. No gross focal neurologic deficits are appreciated.  Normal pronator drift upper extremities.  Cranial nerve exam normal.  No noted photophobia.  Speech is clear.  Moves all extremities 5-5 strength.  Able to sit up on her own without difficulty.  Currently drinking a soda Skin:  Skin is warm, dry and intact. No rash noted. Psychiatric: Mood and affect are normal. Speech and behavior are normal.  ____________________________________________   LABS (all labs ordered are listed, but only abnormal results are displayed)  Labs  Reviewed  RESP PANEL BY RT-PCR (FLU A&B, COVID) ARPGX2 - Abnormal; Notable for the following components:      Result Value   SARS Coronavirus 2 by RT PCR POSITIVE (*)    All other components within normal limits  BASIC METABOLIC PANEL - Abnormal; Notable for the following components:   Potassium 3.2 (*)    Glucose, Bld 121 (*)    All other components within normal limits  URINALYSIS, COMPLETE (UACMP) WITH MICROSCOPIC - Abnormal; Notable for the following components:   Color, Urine YELLOW (*)    APPearance HAZY (*)    Hgb urine dipstick SMALL (*)    All other components within normal limits  CBC  HEPATIC FUNCTION PANEL  CBG MONITORING, ED  TROPONIN I (HIGH SENSITIVITY)   ____________________________________________  EKG  Reviewed inter by me at 1115 Heart rate 120 QRS 85 QTc 430 Sinus tachycardia, mild nonspecific T wave abnormality may be rate related cannot rule out ischemia ____________________________________________  RADIOLOGY  DG Chest 2 View  Result Date: 07/29/2020 CLINICAL DATA:  Fever, cough, nausea and  weakness. EXAM: CHEST - 2 VIEW COMPARISON:  PA and lateral chest 02/04/2016. FINDINGS: Lungs clear. Heart size normal. No pneumothorax or pleural fluid. No acute or focal bony abnormality. IMPRESSION: Negative chest. Electronically Signed   By: Drusilla Kanner M.D.   On: 07/29/2020 14:19    Chest x-ray reviewed negative for acute ____________________________________________   PROCEDURES  Procedure(s) performed: None  Procedures  Critical Care performed: No  ____________________________________________   INITIAL IMPRESSION / ASSESSMENT AND PLAN / ED COURSE  Pertinent labs & imaging results that were available during my care of the patient were reviewed by me and considered in my medical decision making (see chart for details).   Patient presents with slight cough yesterday chills body aches fatigue today.  Does have asthma no evidence of acute exacerbation, but given her history of illness asthma and some coughing use of rescue inhaler yesterday will utilize a nebulizer and start on prednisone.  Overall constellation of symptoms seem to suggest some sort of viral illness, which to exclude pneumonia, infiltrate, no intra-abdominal symptoms.  Urinalysis clean  Laboratory evaluation including CBC metabolic panel reassuring.  Mild hypokalemia repleted.  COVID test is returned positive which seems to correlate well with the patient's symptoms.  Clinical Course as of 07/29/20 1455  Tue Jul 29, 2020  1454 Patient is resting comfortably feels improved.  Ambulating back and forth to the bathroom without difficulty.  Notified the patient of her positive COVID test, appears likely to be on day 1 of illness.  Also had mild complaint of respiratory symptoms possibly very mild asthma exacerbation.  Will start on prednisone, patient requested a Symbicort refill as she is run out, has albuterol at home, also discussed risk benefits and will prescribe Paxil good.  Return precautions recommendations for  all follow-up and treatment discussed with the patient is understanding agreeable with plan [MQ]  1455 She is alert well oriented nontoxic ambulatory without difficulty at this time. [MQ]    Clinical Course User Index [MQ] Sharyn Creamer, MD     ____________________________________________   FINAL CLINICAL IMPRESSION(S) / ED DIAGNOSES  Final diagnoses:  COVID-19        Note:  This document was prepared using Dragon voice recognition software and may include unintentional dictation errors       Sharyn Creamer, MD 07/29/20 1456

## 2020-07-29 NOTE — ED Triage Notes (Signed)
Pt here with weakness and a headache after she woke up this morning. Pt is also nauseous but has not vomited. Pt stable in triage.

## 2020-07-29 NOTE — ED Notes (Signed)
LouAnne from Patient Relations has called the patient's daughter to pick her up. Patient remains in the room at this time.

## 2020-07-29 NOTE — ED Notes (Signed)
Dr. Fanny Bien aware of patient's positive Covid status.

## 2021-07-30 ENCOUNTER — Encounter: Payer: Self-pay | Admitting: Emergency Medicine

## 2021-07-30 ENCOUNTER — Other Ambulatory Visit: Payer: Self-pay

## 2021-07-30 ENCOUNTER — Emergency Department: Payer: BC Managed Care – PPO

## 2021-07-30 ENCOUNTER — Emergency Department
Admission: EM | Admit: 2021-07-30 | Discharge: 2021-07-30 | Disposition: A | Discharge status: Home / Self Care | Payer: BC Managed Care – PPO | Attending: Emergency Medicine | Admitting: Emergency Medicine

## 2021-07-30 DIAGNOSIS — R1011 Right upper quadrant pain: Secondary | ICD-10-CM | POA: Diagnosis present

## 2021-07-30 DIAGNOSIS — J45909 Unspecified asthma, uncomplicated: Secondary | ICD-10-CM | POA: Insufficient documentation

## 2021-07-30 DIAGNOSIS — I10 Essential (primary) hypertension: Secondary | ICD-10-CM | POA: Diagnosis not present

## 2021-07-30 DIAGNOSIS — E876 Hypokalemia: Secondary | ICD-10-CM | POA: Diagnosis not present

## 2021-07-30 LAB — CBC
HCT: 39.9 % (ref 36.0–46.0)
Hemoglobin: 13.1 g/dL (ref 12.0–15.0)
MCH: 28.2 pg (ref 26.0–34.0)
MCHC: 32.8 g/dL (ref 30.0–36.0)
MCV: 86 fL (ref 80.0–100.0)
Platelets: 335 10*3/uL (ref 150–400)
RBC: 4.64 MIL/uL (ref 3.87–5.11)
RDW: 13.4 % (ref 11.5–15.5)
WBC: 7.5 10*3/uL (ref 4.0–10.5)
nRBC: 0 % (ref 0.0–0.2)

## 2021-07-30 LAB — COMPREHENSIVE METABOLIC PANEL
ALT: 17 U/L (ref 0–44)
AST: 19 U/L (ref 15–41)
Albumin: 4.2 g/dL (ref 3.5–5.0)
Alkaline Phosphatase: 79 U/L (ref 38–126)
Anion gap: 10 (ref 5–15)
BUN: 14 mg/dL (ref 6–20)
CO2: 27 mmol/L (ref 22–32)
Calcium: 9.3 mg/dL (ref 8.9–10.3)
Chloride: 106 mmol/L (ref 98–111)
Creatinine, Ser: 0.83 mg/dL (ref 0.44–1.00)
GFR, Estimated: >60 mL/min (ref >60)
Glucose, Bld: 95 mg/dL (ref 70–99)
Potassium: 3.3 mmol/L — ABNORMAL LOW (ref 3.5–5.1)
Sodium: 143 mmol/L (ref 135–145)
Total Bilirubin: 0.5 mg/dL (ref 0.3–1.2)
Total Protein: 7.4 g/dL (ref 6.5–8.1)

## 2021-07-30 LAB — LIPASE, BLOOD: Lipase: 31 U/L (ref 11–51)

## 2021-07-30 MED ORDER — ALUM & MAG HYDROXIDE-SIMETH 200-200-20 MG/5ML PO SUSP
30.0000 mL | Freq: Once | ORAL | Status: AC
  Administered 2021-07-30: 30 mL via ORAL
  Filled 2021-07-30: qty 30

## 2021-07-30 MED ORDER — PANTOPRAZOLE SODIUM 40 MG PO TBEC: 40.0000 mg | DELAYED_RELEASE_TABLET | Freq: Every day | ORAL | 0 refills | Status: DC

## 2021-07-30 MED ORDER — POTASSIUM CHLORIDE CRYS ER 20 MEQ PO TBCR
40.0000 meq | EXTENDED_RELEASE_TABLET | Freq: Once | ORAL | Status: AC
  Administered 2021-07-30: 40 meq via ORAL
  Filled 2021-07-30: qty 2

## 2021-07-30 MED ORDER — PANTOPRAZOLE SODIUM 40 MG PO TBEC
40.0000 mg | DELAYED_RELEASE_TABLET | Freq: Once | ORAL | Status: AC
  Administered 2021-07-30: 40 mg via ORAL
  Filled 2021-07-30: qty 1

## 2021-07-30 MED ORDER — SUCRALFATE 1 G PO TABS
1.0000 g | ORAL_TABLET | Freq: Once | ORAL | Status: AC
  Administered 2021-07-30: 1 g via ORAL
  Filled 2021-07-30: qty 1

## 2021-07-30 MED ORDER — IOHEXOL 300 MG/ML  SOLN
100.0000 mL | Freq: Once | INTRAMUSCULAR | Status: AC | PRN
  Administered 2021-07-30: 100 mL via INTRAVENOUS

## 2021-07-30 MED ORDER — SUCRALFATE 1 G PO TABS: 1.0000 g | ORAL_TABLET | Freq: Four times a day (QID) | ORAL | 0 refills | Status: DC

## 2021-07-30 NOTE — Discharge Instructions (Addendum)
Please have your blood pressure rechecked at your follow-up visit as it was elevated today.

## 2021-07-30 NOTE — ED Provider Notes (Signed)
[facility] Provider Note    Event Date/Time   First MD Initiated Contact with Patient 07/30/21 [id number]     (approximate)   History   Abdominal Pain   HPI  Vickie Hamilton is a 56 y.o. female with a past medical history of asthma, previous tobacco abuse and remote cholecystectomy who presents for evaluation of 3 days of some persistent right upper quadrant abdominal pain.  She denies any fevers, cough, chest pain, nausea, vomiting, diarrhea, constipation, back pain rash or extremity pain.  Denies any EtOH use, significant NSAID use or history of ulcers.  No clear leaving aggravating factors including position movement or food intake.    Past Medical History:  Diagnosis Date   Asthma      Physical Exam  Triage Vital Signs: ED Triage Vitals  Enc Vitals Group     BP 07/30/21 0953 (!) 164/79     Pulse Rate 07/30/21 0953 81     Resp 07/30/21 0953 15     Temp 07/30/21 0953 98.9 F (37.2 C)     Temp Source 07/30/21 0953 Oral     SpO2 07/30/21 0953 100 %     Weight --      Height --      Head Circumference --      Peak Flow --      Pain Score 07/30/21 0951 6     Pain Loc --      Pain Edu? --      Excl. in GC? --     Most recent vital signs: Vitals:   07/30/21 0953 07/30/21 1000  BP: (!) 164/79 (!) 161/81  Pulse: 81 89  Resp: 15   Temp: 98.9 F (37.2 C)   SpO2: 100% 97%    General: Awake, no distress.  CV:  Good peripheral perfusion.  2+ radial pulse. Resp:  Normal effort.  We are bilaterally. Abd:  No distention.  Mild tenderness in epigastrium and right upper quadrant.  Otherwise soft throughout.  No CVA tenderness.  No rash. Other:     ED Results / Procedures / Treatments  Labs (all labs ordered are listed, but only abnormal results are displayed) Labs Reviewed  COMPREHENSIVE METABOLIC PANEL - Abnormal; Notable for the following components:      Result Value   Potassium 3.3 (*)    All other components within normal limits   LIPASE, BLOOD  CBC     EKG     RADIOLOGY  CT abdomen pelvis on my interpretation without evidence of pancreatitis, perforated peptic ulcer, diverticulitis, kidney stone or other clear acute abdominal or pelvic process.  I reviewed radiologist rotation and agree their findings of no evidence of acute relies on upper quadrant to explain patient's pain as well as normal appendix and aortic atherosclerosis.   PROCEDURES:  Critical Care performed: No  Procedures    MEDICATIONS ORDERED IN ED: Medications  potassium chloride SA (KLOR-CON M) CR tablet 40 mEq (has no administration in time range)  pantoprazole (PROTONIX) EC tablet 40 mg (has no administration in time range)  alum & mag hydroxide-simeth (MAALOX/MYLANTA) 200-200-20 MG/5ML suspension 30 mL (has no administration in time range)  sucralfate (CARAFATE) tablet 1 g (has no administration in time range)  iohexol (OMNIPAQUE) 300 MG/ML solution 100 mL (100 mLs Intravenous Contrast Given 07/30/21 1116)     IMPRESSION / MDM / ASSESSMENT AND PLAN / ED COURSE  I reviewed the triage vital signs and the nursing notes. Patient's presentation  is most consistent with acute presentation with potential threat to life or bodily function.                               Differential diagnosis includes, but is not limited to gastritis, duodenitis, diverticulitis, pancreatitis, peptic ulcer disease, hepatitis, kidney stone, atypical anginal presentation, shingles  Patient refusing EKG after explaining concern for possible atypical ACS presentation.  Explained that she could have a heart attack but I am unable to diagnose without initial EKG she still declining this and I think he has capacity to make this decision.   CT abdomen pelvis on my interpretation without evidence of pancreatitis, perforated peptic ulcer, diverticulitis, kidney stone or other clear acute abdominal or pelvic process.  I reviewed radiologist rotation and agree their  findings of no evidence of acute relies on upper quadrant to explain patient's pain as well as normal appendix and aortic atherosclerosis.  Lipase WNL not suggestive of pancreatitis.  CMP is remarkable for K of 3.3 without any other significant electrolyte or metabolic derangements.  No evidence of acute hepatitis or cholestatic process.  CBC unremarkable  Patient declining any opioid analgesia.  Discussed with him unclear the precise etiology for pain although is possible she has peptic ulcer disease, gastritis, duodenitis or other GI related issues and I cannot see at this time on CT.  She does refusing to provide a urine sample to rule out a UTI although I feel is less likely based on history and exam.  She is agreeable to a short course of Protonix and Carafate which I will prescribe.  Discussed following up with her PCP and recommend she have her blood pressure rechecked at that time.  Discharged in stable condition.  Strict return precautions advised and discussed.      FINAL CLINICAL IMPRESSION(S) / ED DIAGNOSES   Final diagnoses:  RUQ pain  Hypertension, unspecified type     Rx / DC Orders   ED Discharge Orders          Ordered    pantoprazole (PROTONIX) 40 MG tablet  Daily        07/30/21 1233    sucralfate (CARAFATE) 1 g tablet  4 times daily        07/30/21 1233             Note:  This document was prepared using Dragon voice recognition software and may include unintentional dictation errors.   Gilles Chiquito, MD 07/30/21 1235

## 2021-07-30 NOTE — ED Triage Notes (Signed)
Pt reports RUQ abdominal pain that started 2 days ago. Denies n/v/d and fevers.

## 2021-07-30 NOTE — ED Notes (Signed)
Pt refused EKG. Pt informed of protocol, Pt states she does not need one done.

## 2022-05-22 ENCOUNTER — Inpatient Hospital Stay
Admission: EM | Admit: 2022-05-22 | Discharge: 2022-05-25 | DRG: 494 | Disposition: A | Discharge status: Home Health | Payer: BC Managed Care – PPO | Attending: Internal Medicine | Admitting: Internal Medicine

## 2022-05-22 ENCOUNTER — Emergency Department: Payer: BC Managed Care – PPO

## 2022-05-22 ENCOUNTER — Other Ambulatory Visit: Payer: Self-pay

## 2022-05-22 ENCOUNTER — Inpatient Hospital Stay: Payer: BC Managed Care – PPO

## 2022-05-22 ENCOUNTER — Encounter: Payer: Self-pay | Admitting: Emergency Medicine

## 2022-05-22 ENCOUNTER — Inpatient Hospital Stay: Payer: BC Managed Care – PPO | Admitting: General Practice

## 2022-05-22 ENCOUNTER — Encounter
Admission: EM | Disposition: A | Discharge status: Home Health | Payer: Self-pay | Source: Home / Self Care | Attending: Internal Medicine

## 2022-05-22 DIAGNOSIS — S9306XA Dislocation of unspecified ankle joint, initial encounter: Secondary | ICD-10-CM | POA: Diagnosis present

## 2022-05-22 DIAGNOSIS — S82852A Displaced trimalleolar fracture of left lower leg, initial encounter for closed fracture: Secondary | ICD-10-CM | POA: Diagnosis present

## 2022-05-22 DIAGNOSIS — Z886 Allergy status to analgesic agent status: Secondary | ICD-10-CM | POA: Diagnosis not present

## 2022-05-22 DIAGNOSIS — Z888 Allergy status to other drugs, medicaments and biological substances status: Secondary | ICD-10-CM

## 2022-05-22 DIAGNOSIS — Z7951 Long term (current) use of inhaled steroids: Secondary | ICD-10-CM

## 2022-05-22 DIAGNOSIS — W109XXA Fall (on) (from) unspecified stairs and steps, initial encounter: Secondary | ICD-10-CM | POA: Diagnosis present

## 2022-05-22 DIAGNOSIS — E669 Obesity, unspecified: Secondary | ICD-10-CM | POA: Diagnosis present

## 2022-05-22 DIAGNOSIS — I10 Essential (primary) hypertension: Secondary | ICD-10-CM | POA: Diagnosis present

## 2022-05-22 DIAGNOSIS — W19XXXA Unspecified fall, initial encounter: Secondary | ICD-10-CM

## 2022-05-22 DIAGNOSIS — Y9301 Activity, walking, marching and hiking: Secondary | ICD-10-CM | POA: Diagnosis present

## 2022-05-22 DIAGNOSIS — Z6830 Body mass index (BMI) 30.0-30.9, adult: Secondary | ICD-10-CM

## 2022-05-22 DIAGNOSIS — Z79899 Other long term (current) drug therapy: Secondary | ICD-10-CM | POA: Diagnosis not present

## 2022-05-22 DIAGNOSIS — Z720 Tobacco use: Secondary | ICD-10-CM | POA: Insufficient documentation

## 2022-05-22 DIAGNOSIS — K219 Gastro-esophageal reflux disease without esophagitis: Secondary | ICD-10-CM | POA: Diagnosis present

## 2022-05-22 DIAGNOSIS — J45909 Unspecified asthma, uncomplicated: Secondary | ICD-10-CM | POA: Diagnosis present

## 2022-05-22 DIAGNOSIS — D72829 Elevated white blood cell count, unspecified: Secondary | ICD-10-CM | POA: Diagnosis not present

## 2022-05-22 DIAGNOSIS — Y92009 Unspecified place in unspecified non-institutional (private) residence as the place of occurrence of the external cause: Secondary | ICD-10-CM

## 2022-05-22 DIAGNOSIS — Z87891 Personal history of nicotine dependence: Secondary | ICD-10-CM

## 2022-05-22 DIAGNOSIS — S82892A Other fracture of left lower leg, initial encounter for closed fracture: Secondary | ICD-10-CM | POA: Diagnosis present

## 2022-05-22 HISTORY — PX: ORIF ANKLE FRACTURE: SHX5408

## 2022-05-22 HISTORY — DX: Tobacco use: Z72.0

## 2022-05-22 HISTORY — DX: Essential (primary) hypertension: I10

## 2022-05-22 HISTORY — DX: Gastro-esophageal reflux disease without esophagitis: K21.9

## 2022-05-22 LAB — BASIC METABOLIC PANEL
Anion gap: 9 (ref 5–15)
BUN: 22 mg/dL — ABNORMAL HIGH (ref 6–20)
CO2: 27 mmol/L (ref 22–32)
Calcium: 9 mg/dL (ref 8.9–10.3)
Chloride: 101 mmol/L (ref 98–111)
Creatinine, Ser: 0.89 mg/dL (ref 0.44–1.00)
GFR, Estimated: >60 mL/min (ref >60)
Glucose, Bld: 125 mg/dL — ABNORMAL HIGH (ref 70–99)
Potassium: 3.5 mmol/L (ref 3.5–5.1)
Sodium: 137 mmol/L (ref 135–145)

## 2022-05-22 LAB — CBC
HCT: 42.5 % (ref 36.0–46.0)
Hemoglobin: 14.3 g/dL (ref 12.0–15.0)
MCH: 29.3 pg (ref 26.0–34.0)
MCHC: 33.6 g/dL (ref 30.0–36.0)
MCV: 87.1 fL (ref 80.0–100.0)
Platelets: 365 10*3/uL (ref 150–400)
RBC: 4.88 MIL/uL (ref 3.87–5.11)
RDW: 13 % (ref 11.5–15.5)
WBC: 17.1 10*3/uL — ABNORMAL HIGH (ref 4.0–10.5)
nRBC: 0 % (ref 0.0–0.2)

## 2022-05-22 LAB — SURGICAL PCR SCREEN
MRSA, PCR: NEGATIVE
Staphylococcus aureus: NEGATIVE

## 2022-05-22 LAB — PROTIME-INR
INR: 1.1 (ref 0.8–1.2)
Prothrombin Time: 14 seconds (ref 11.4–15.2)

## 2022-05-22 LAB — HIV ANTIBODY (ROUTINE TESTING W REFLEX): HIV Screen 4th Generation wRfx: NONREACTIVE

## 2022-05-22 LAB — TYPE AND SCREEN
ABO/RH(D): O POS
Antibody Screen: NEGATIVE

## 2022-05-22 LAB — APTT: aPTT: 26 seconds (ref 24–36)

## 2022-05-22 SURGERY — OPEN REDUCTION INTERNAL FIXATION (ORIF) ANKLE FRACTURE
Anesthesia: General | Site: Ankle | Laterality: Left

## 2022-05-22 MED ORDER — TRAMADOL HCL 50 MG PO TABS
50.0000 mg | ORAL_TABLET | Freq: Four times a day (QID) | ORAL | Status: DC
  Administered 2022-05-22 – 2022-05-25 (×10): 50 mg via ORAL
  Filled 2022-05-22 (×12): qty 1

## 2022-05-22 MED ORDER — FENTANYL CITRATE (PF) 100 MCG/2ML IJ SOLN
25.0000 ug | INTRAMUSCULAR | Status: DC | PRN
  Administered 2022-05-22 (×2): 25 ug via INTRAVENOUS
  Administered 2022-05-22: 50 ug via INTRAVENOUS

## 2022-05-22 MED ORDER — ACETAMINOPHEN 325 MG PO TABS: 650.0000 mg | ORAL_TABLET | Freq: Four times a day (QID) | ORAL | Status: DC | PRN

## 2022-05-22 MED ORDER — SODIUM CHLORIDE 0.9 % IV SOLN: INTRAVENOUS | Status: DC

## 2022-05-22 MED ORDER — ONDANSETRON HCL 4 MG/2ML IJ SOLN
4.0000 mg | Freq: Once | INTRAMUSCULAR | Status: AC
  Administered 2022-05-22: 4 mg via INTRAVENOUS
  Filled 2022-05-22: qty 2

## 2022-05-22 MED ORDER — 0.9 % SODIUM CHLORIDE (POUR BTL) OPTIME
TOPICAL | Status: DC | PRN
  Administered 2022-05-22: 1000 mL

## 2022-05-22 MED ORDER — PROPOFOL 10 MG/ML IV BOLUS
INTRAVENOUS | Status: DC | PRN
  Administered 2022-05-22: 150 mg via INTRAVENOUS

## 2022-05-22 MED ORDER — METHOCARBAMOL 1000 MG/10ML IJ SOLN: 500.0000 mg | Freq: Four times a day (QID) | INTRAVENOUS | Status: DC | PRN

## 2022-05-22 MED ORDER — ACETAMINOPHEN 325 MG PO TABS: 325.0000 mg | ORAL_TABLET | Freq: Four times a day (QID) | ORAL | Status: DC | PRN

## 2022-05-22 MED ORDER — LACTATED RINGERS IV SOLN: INTRAVENOUS | Status: DC | PRN

## 2022-05-22 MED ORDER — OXYCODONE HCL 5 MG PO TABS
5.0000 mg | ORAL_TABLET | Freq: Once | ORAL | Status: AC | PRN
  Administered 2022-05-22: 5 mg via ORAL

## 2022-05-22 MED ORDER — POLYETHYLENE GLYCOL 3350 17 G PO PACK: 17.0000 g | PACK | Freq: Every day | ORAL | Status: DC | PRN

## 2022-05-22 MED ORDER — ALUM & MAG HYDROXIDE-SIMETH 200-200-20 MG/5ML PO SUSP: 30.0000 mL | ORAL | Status: DC | PRN

## 2022-05-22 MED ORDER — OXYCODONE HCL 5 MG/5ML PO SOLN: 5.0000 mg | Freq: Once | ORAL | Status: AC | PRN

## 2022-05-22 MED ORDER — SENNOSIDES-DOCUSATE SODIUM 8.6-50 MG PO TABS: 1.0000 | ORAL_TABLET | Freq: Every evening | ORAL | Status: DC | PRN

## 2022-05-22 MED ORDER — CEFAZOLIN SODIUM-DEXTROSE 2-4 GM/100ML-% IV SOLN
2.0000 g | Freq: Four times a day (QID) | INTRAVENOUS | Status: AC
  Administered 2022-05-22 – 2022-05-23 (×2): 2 g via INTRAVENOUS
  Filled 2022-05-22 (×2): qty 100

## 2022-05-22 MED ORDER — CEFAZOLIN SODIUM-DEXTROSE 2-4 GM/100ML-% IV SOLN
2.0000 g | INTRAVENOUS | Status: AC
  Administered 2022-05-22: 2 g via INTRAVENOUS

## 2022-05-22 MED ORDER — PANTOPRAZOLE SODIUM 40 MG PO TBEC
40.0000 mg | DELAYED_RELEASE_TABLET | Freq: Every day | ORAL | Status: DC
  Administered 2022-05-22 – 2022-05-25 (×4): 40 mg via ORAL
  Filled 2022-05-22 (×4): qty 1

## 2022-05-22 MED ORDER — LISINOPRIL 10 MG PO TABS
10.0000 mg | ORAL_TABLET | Freq: Every day | ORAL | Status: DC
  Administered 2022-05-23 – 2022-05-25 (×3): 10 mg via ORAL
  Filled 2022-05-22 (×4): qty 1

## 2022-05-22 MED ORDER — ACETAMINOPHEN 10 MG/ML IV SOLN
INTRAVENOUS | Status: AC
  Filled 2022-05-22: qty 100

## 2022-05-22 MED ORDER — ONDANSETRON HCL 4 MG/2ML IJ SOLN
4.0000 mg | Freq: Four times a day (QID) | INTRAMUSCULAR | Status: DC | PRN
  Administered 2022-05-22 – 2022-05-23 (×2): 4 mg via INTRAVENOUS
  Filled 2022-05-22 (×2): qty 2

## 2022-05-22 MED ORDER — SENNA 8.6 MG PO TABS
1.0000 | ORAL_TABLET | Freq: Two times a day (BID) | ORAL | Status: DC
  Administered 2022-05-22 – 2022-05-25 (×6): 8.6 mg via ORAL
  Filled 2022-05-22 (×6): qty 1

## 2022-05-22 MED ORDER — FENTANYL CITRATE (PF) 100 MCG/2ML IJ SOLN
INTRAMUSCULAR | Status: AC
  Filled 2022-05-22: qty 2

## 2022-05-22 MED ORDER — BUPIVACAINE HCL (PF) 0.5 % IJ SOLN
INTRAMUSCULAR | Status: DC | PRN
  Administered 2022-05-22: 30 mL

## 2022-05-22 MED ORDER — METHOCARBAMOL 500 MG PO TABS
500.0000 mg | ORAL_TABLET | Freq: Four times a day (QID) | ORAL | Status: DC | PRN
  Administered 2022-05-24 – 2022-05-25 (×3): 500 mg via ORAL
  Filled 2022-05-22 (×3): qty 1

## 2022-05-22 MED ORDER — PHENOL 1.4 % MT LIQD: 1.0000 | OROMUCOSAL | Status: DC | PRN

## 2022-05-22 MED ORDER — LIDOCAINE HCL (CARDIAC) PF 100 MG/5ML IV SOSY
PREFILLED_SYRINGE | INTRAVENOUS | Status: DC | PRN
  Administered 2022-05-22: 100 mg via INTRAVENOUS

## 2022-05-22 MED ORDER — PROPOFOL 10 MG/ML IV BOLUS
INTRAVENOUS | Status: AC
  Filled 2022-05-22: qty 20

## 2022-05-22 MED ORDER — DM-GUAIFENESIN ER 30-600 MG PO TB12: 1.0000 | ORAL_TABLET | Freq: Two times a day (BID) | ORAL | Status: DC | PRN

## 2022-05-22 MED ORDER — FENTANYL CITRATE (PF) 100 MCG/2ML IJ SOLN
INTRAMUSCULAR | Status: DC | PRN
  Administered 2022-05-22 (×3): 25 ug via INTRAVENOUS

## 2022-05-22 MED ORDER — PROPOFOL 10 MG/ML IV BOLUS
INTRAVENOUS | Status: AC | PRN
  Administered 2022-05-22: 30 mg via INTRAVENOUS
  Administered 2022-05-22: 20 mg via INTRAVENOUS

## 2022-05-22 MED ORDER — ONDANSETRON HCL 4 MG/2ML IJ SOLN
INTRAMUSCULAR | Status: DC | PRN
  Administered 2022-05-22: 4 mg via INTRAVENOUS

## 2022-05-22 MED ORDER — METHOCARBAMOL 500 MG PO TABS: 500.0000 mg | ORAL_TABLET | Freq: Three times a day (TID) | ORAL | Status: DC | PRN

## 2022-05-22 MED ORDER — ACETAMINOPHEN 500 MG PO TABS
1000.0000 mg | ORAL_TABLET | Freq: Four times a day (QID) | ORAL | Status: DC
  Administered 2022-05-22 – 2022-05-25 (×9): 1000 mg via ORAL
  Filled 2022-05-22 (×11): qty 2

## 2022-05-22 MED ORDER — OXYCODONE HCL 5 MG PO TABS
10.0000 mg | ORAL_TABLET | ORAL | Status: DC | PRN
  Administered 2022-05-23 – 2022-05-24 (×2): 15 mg via ORAL
  Filled 2022-05-22 (×2): qty 3

## 2022-05-22 MED ORDER — ONDANSETRON HCL 4 MG/2ML IJ SOLN: 4.0000 mg | Freq: Three times a day (TID) | INTRAMUSCULAR | Status: DC | PRN

## 2022-05-22 MED ORDER — OXYCODONE HCL 5 MG PO TABS
ORAL_TABLET | ORAL | Status: AC
  Filled 2022-05-22: qty 1

## 2022-05-22 MED ORDER — HYDROMORPHONE HCL 1 MG/ML IJ SOLN
0.5000 mg | INTRAMUSCULAR | Status: DC | PRN
  Filled 2022-05-22: qty 1

## 2022-05-22 MED ORDER — ACETAMINOPHEN 10 MG/ML IV SOLN
INTRAVENOUS | Status: DC | PRN
  Administered 2022-05-22: 1000 mg via INTRAVENOUS

## 2022-05-22 MED ORDER — PROPOFOL 10 MG/ML IV BOLUS
0.5000 mg/kg | Freq: Once | INTRAVENOUS | Status: AC
  Administered 2022-05-22: 40.8 mg via INTRAVENOUS
  Filled 2022-05-22: qty 20

## 2022-05-22 MED ORDER — ALPRAZOLAM 0.25 MG PO TABS: 0.2500 mg | ORAL_TABLET | Freq: Every evening | ORAL | Status: DC | PRN

## 2022-05-22 MED ORDER — MIDAZOLAM HCL 2 MG/2ML IJ SOLN
INTRAMUSCULAR | Status: DC | PRN
  Administered 2022-05-22: 2 mg via INTRAVENOUS

## 2022-05-22 MED ORDER — MIDAZOLAM HCL 2 MG/2ML IJ SOLN
INTRAMUSCULAR | Status: AC
  Filled 2022-05-22: qty 2

## 2022-05-22 MED ORDER — MOMETASONE FURO-FORMOTEROL FUM 200-5 MCG/ACT IN AERO
2.0000 | INHALATION_SPRAY | Freq: Two times a day (BID) | RESPIRATORY_TRACT | Status: DC
  Administered 2022-05-23 – 2022-05-25 (×5): 2 via RESPIRATORY_TRACT
  Filled 2022-05-22: qty 8.8

## 2022-05-22 MED ORDER — ONDANSETRON HCL 4 MG PO TABS: 4.0000 mg | ORAL_TABLET | Freq: Four times a day (QID) | ORAL | Status: DC | PRN

## 2022-05-22 MED ORDER — HYDROMORPHONE HCL 1 MG/ML IJ SOLN
1.0000 mg | INTRAMUSCULAR | Status: DC | PRN
  Administered 2022-05-22: 1 mg via INTRAVENOUS
  Filled 2022-05-22: qty 1

## 2022-05-22 MED ORDER — HYDRALAZINE HCL 20 MG/ML IJ SOLN: 5.0000 mg | INTRAMUSCULAR | Status: DC | PRN

## 2022-05-22 MED ORDER — BISACODYL 10 MG RE SUPP: 10.0000 mg | Freq: Every day | RECTAL | Status: DC | PRN

## 2022-05-22 MED ORDER — MUPIROCIN 2 % EX OINT
1.0000 | TOPICAL_OINTMENT | Freq: Two times a day (BID) | CUTANEOUS | Status: DC
  Administered 2022-05-23 – 2022-05-25 (×5): 1 via NASAL
  Filled 2022-05-22: qty 22

## 2022-05-22 MED ORDER — MENTHOL 3 MG MT LOZG: 1.0000 | LOZENGE | OROMUCOSAL | Status: DC | PRN

## 2022-05-22 MED ORDER — NICOTINE 21 MG/24HR TD PT24
21.0000 mg | MEDICATED_PATCH | Freq: Every day | TRANSDERMAL | Status: DC
  Filled 2022-05-22: qty 1

## 2022-05-22 MED ORDER — MAGNESIUM CITRATE PO SOLN: 1.0000 | Freq: Once | ORAL | Status: DC | PRN

## 2022-05-22 MED ORDER — MOMETASONE FURO-FORMOTEROL FUM 100-5 MCG/ACT IN AERO
2.0000 | INHALATION_SPRAY | Freq: Two times a day (BID) | RESPIRATORY_TRACT | Status: DC
  Filled 2022-05-22: qty 8.8

## 2022-05-22 MED ORDER — ALBUTEROL SULFATE (2.5 MG/3ML) 0.083% IN NEBU: 3.0000 mL | INHALATION_SOLUTION | RESPIRATORY_TRACT | Status: DC | PRN

## 2022-05-22 MED ORDER — FENTANYL CITRATE PF 50 MCG/ML IJ SOSY
50.0000 ug | PREFILLED_SYRINGE | Freq: Once | INTRAMUSCULAR | Status: AC | PRN
  Administered 2022-05-22: 50 ug via INTRAVENOUS
  Filled 2022-05-22: qty 1

## 2022-05-22 MED ORDER — DEXAMETHASONE SODIUM PHOSPHATE 10 MG/ML IJ SOLN
INTRAMUSCULAR | Status: DC | PRN
  Administered 2022-05-22: 10 mg via INTRAVENOUS

## 2022-05-22 MED ORDER — PHENYLEPHRINE HCL (PRESSORS) 10 MG/ML IV SOLN
INTRAVENOUS | Status: DC | PRN
  Administered 2022-05-22: 160 ug via INTRAVENOUS

## 2022-05-22 MED ORDER — LIDOCAINE HCL (PF) 2 % IJ SOLN
INTRAMUSCULAR | Status: AC
  Filled 2022-05-22: qty 5

## 2022-05-22 MED ORDER — OXYCODONE-ACETAMINOPHEN 5-325 MG PO TABS: 1.0000 | ORAL_TABLET | ORAL | Status: DC | PRN

## 2022-05-22 MED ORDER — OXYCODONE HCL 5 MG PO TABS
5.0000 mg | ORAL_TABLET | ORAL | Status: DC | PRN
  Administered 2022-05-22: 5 mg via ORAL
  Administered 2022-05-23: 10 mg via ORAL
  Administered 2022-05-23: 5 mg via ORAL
  Administered 2022-05-23 – 2022-05-24 (×2): 10 mg via ORAL
  Filled 2022-05-22: qty 1
  Filled 2022-05-22 (×4): qty 2

## 2022-05-22 MED ORDER — CEFAZOLIN SODIUM-DEXTROSE 2-4 GM/100ML-% IV SOLN
INTRAVENOUS | Status: AC
  Filled 2022-05-22: qty 100

## 2022-05-22 MED ORDER — ENOXAPARIN SODIUM 40 MG/0.4ML IJ SOSY
40.0000 mg | PREFILLED_SYRINGE | INTRAMUSCULAR | Status: DC
  Administered 2022-05-23 – 2022-05-25 (×3): 40 mg via SUBCUTANEOUS
  Filled 2022-05-22 (×3): qty 0.4

## 2022-05-22 MED ORDER — DOCUSATE SODIUM 100 MG PO CAPS
100.0000 mg | ORAL_CAPSULE | Freq: Two times a day (BID) | ORAL | Status: DC
  Administered 2022-05-22 – 2022-05-25 (×6): 100 mg via ORAL
  Filled 2022-05-22 (×6): qty 1

## 2022-05-22 SURGICAL SUPPLY — 56 items
BASIN KIT SINGLE STR (MISCELLANEOUS) IMPLANT
BIT DRILL 2.5X110 QC LCP DISP (BIT) IMPLANT
BIT DRILL CANN 2.7X625 NONSTRL (BIT) IMPLANT
BLADE SURG 15 STRL LF DISP TIS (BLADE) ×1 IMPLANT
BLADE SURG 15 STRL SS (BLADE) ×1
BNDG CMPR 5X4 CHSV STRCH STRL (GAUZE/BANDAGES/DRESSINGS) ×1
BNDG CMPR STD VLCR NS LF 5.8X4 (GAUZE/BANDAGES/DRESSINGS) ×2
BNDG COHESIVE 4X5 TAN STRL LF (GAUZE/BANDAGES/DRESSINGS) ×1 IMPLANT
BNDG ELASTIC 4X5.8 VLCR NS LF (GAUZE/BANDAGES/DRESSINGS) ×2 IMPLANT
BNDG ESMARCH 6 X 12 STRL LF (GAUZE/BANDAGES/DRESSINGS) ×1
BNDG ESMARCH 6X12 STRL LF (GAUZE/BANDAGES/DRESSINGS) ×1 IMPLANT
CUFF TOURN SGL QUICK 34 (TOURNIQUET CUFF)
CUFF TRNQT CYL 34X4.125X (TOURNIQUET CUFF) IMPLANT
DRAPE FLUOR MINI C-ARM 54X84 (DRAPES) ×1 IMPLANT
DRAPE INCISE IOBAN 66X45 STRL (DRAPES) ×1 IMPLANT
DRAPE U-SHAPE 47X51 STRL (DRAPES) ×1 IMPLANT
DRSG XEROFORM 1X8 (GAUZE/BANDAGES/DRESSINGS) IMPLANT
DURAPREP 26ML APPLICATOR (WOUND CARE) ×2 IMPLANT
ELECT REM PT RETURN 9FT ADLT (ELECTROSURGICAL) ×1
ELECTRODE REM PT RTRN 9FT ADLT (ELECTROSURGICAL) ×1 IMPLANT
GAUZE SPONGE 4X4 12PLY STRL (GAUZE/BANDAGES/DRESSINGS) ×1 IMPLANT
GAUZE XEROFORM 1X8 LF (GAUZE/BANDAGES/DRESSINGS) ×1 IMPLANT
GLOVE BIOGEL PI IND STRL 9 (GLOVE) ×1 IMPLANT
GLOVE BIOGEL PI ORTHO SZ9 (GLOVE) ×4 IMPLANT
GOWN STRL REUS TWL 2XL XL LVL4 (GOWN DISPOSABLE) ×1 IMPLANT
GOWN STRL REUS W/ TWL LRG LVL3 (GOWN DISPOSABLE) ×1 IMPLANT
GOWN STRL REUS W/TWL LRG LVL3 (GOWN DISPOSABLE) ×1
GUIDEWARE NON THREAD 1.25X150 (WIRE) ×2
GUIDEWIRE NON THREAD 1.25X150 (WIRE) IMPLANT
KIT TURNOVER KIT A (KITS) ×1 IMPLANT
LABEL OR SOLS (LABEL) ×1 IMPLANT
MANIFOLD NEPTUNE II (INSTRUMENTS) ×1 IMPLANT
NS IRRIG 1000ML POUR BTL (IV SOLUTION) ×1 IMPLANT
PACK EXTREMITY ARMC (MISCELLANEOUS) ×1 IMPLANT
PAD ABD DERMACEA PRESS 5X9 (GAUZE/BANDAGES/DRESSINGS) ×2 IMPLANT
PAD CAST 4YDX4 CTTN HI CHSV (CAST SUPPLIES) ×3 IMPLANT
PADDING CAST COTTON 4X4 STRL (CAST SUPPLIES) ×3
PLATE LCP 3.5 1/3 TUB 10HX117 (Plate) IMPLANT
REPAIR TROPE KNTLS SS SYNDESMO (Orthopedic Implant) IMPLANT
SCREW CANC FT ST SFS 4X16 (Screw) IMPLANT
SCREW CANN L THRD/44 4.0 (Screw) IMPLANT
SCREW CANN L THRD/48 4.0 (Screw) IMPLANT
SCREW LOCK CORT ST 3.5X12 (Screw) IMPLANT
SCREW LOCK CORT ST 3.5X14 (Screw) IMPLANT
SCREW LOCK CORT ST 3.5X16 (Screw) IMPLANT
SCREW LOCK CORT ST 3.5X18 (Screw) IMPLANT
SPLINT CAST 1 STEP 4X30 (MISCELLANEOUS) ×2 IMPLANT
STAPLER SKIN PROX 35W (STAPLE) ×1 IMPLANT
STOCKINETTE STRL 6IN 960660 (GAUZE/BANDAGES/DRESSINGS) ×1 IMPLANT
SUT ETHILON 4-0 (SUTURE) ×1
SUT ETHILON 4-0 FS2 18XMFL BLK (SUTURE) ×1
SUT VIC AB 2-0 SH 27 (SUTURE) ×3
SUT VIC AB 2-0 SH 27XBRD (SUTURE) ×2 IMPLANT
SUTURE ETHLN 4-0 FS2 18XMF BLK (SUTURE) IMPLANT
TRAP FLUID SMOKE EVACUATOR (MISCELLANEOUS) ×1 IMPLANT
WATER STERILE IRR 500ML POUR (IV SOLUTION) ×1 IMPLANT

## 2022-05-22 NOTE — Sedation Documentation (Signed)
X-ray called at this time. Splint placed by Brayton Caves, NT

## 2022-05-22 NOTE — ED Provider Notes (Addendum)
[facility] Provider Note    Event Date/Time   First MD Initiated Contact with Patient 05/22/22 [phone]     (approximate)   History   Fall   HPI  Vickie Hamilton is a 57 y.o. female with history of asthma, tobacco use presenting to the emergency department for evaluation of ankle pain.  Patient reports that she was walking down her wet steps when she slipped and fell onto her left ankle.  Had immediate pain and deformity in the area.  Did not hit her head.  No LOC.  No symptoms preceding her fall.  Last p.o. last night.  Received 100 mcg of fentanyl with EMS and was placed in a Sam splint.    Physical Exam   Triage Vital Signs: ED Triage Vitals  Enc Vitals Group     BP 05/22/22 0746 (!) 146/72     Pulse Rate 05/22/22 0746 65     Resp 05/22/22 0746 16     Temp 05/22/22 0746 98.2 F (36.8 C)     Temp Source 05/22/22 0746 Oral     SpO2 05/22/22 0746 94 %     Weight 05/22/22 0748 180 lb (81.6 kg)     Height 05/22/22 0748 5\' 4"  (1.626 m)     Head Circumference --      Peak Flow --      Pain Score 05/22/22 0747 10     Pain Loc --      Pain Edu? --      Excl. in GC? --     Most recent vital signs: Vitals:   05/22/22 1040 05/22/22 1101  BP: 130/67 135/71  Pulse: 60 66  Resp: 13 18  Temp:  97.8 F (36.6 C)  SpO2: 100% 97%    Nursing notes and vital signs reviewed.  General: Adult female, laying in bed, awake, interactive Head: Atraumatic Chest: Symmetric chest rise, no tenderness to palpation.  Cardiac: Regular rhythm and rate.  Respiratory: Lungs clear to auscultation Abdomen: Soft, nondistended. No tenderness to palpation.  Pelvis: Stable in AP and lateral compression. No tenderness to palpation. MSK: There is an obvious deformity along the left ankle.  No tenderness over the proximal left lower extremity including over the proximal fibula.  There are 2+ DP pulses bilaterally.  Patient is able to wiggle her toes with intact sensation  throughout the distal extremity.  No other deformity noted and full range of motion of remainder of extremities. Neuro: Alert, oriented. GCS 15. 5 out of 5 strength in bilateral upper and right lower extremity, strength testing of left lower extremity deferred in the setting of deformed ankle. Normal sensation to light touch in bilateral upper and lower extremity. Skin: No evidence of burns or lacerations.  ED Results / Procedures / Treatments   Labs (all labs ordered are listed, but only abnormal results are displayed) Labs Reviewed  CBC - Abnormal; Notable for the following components:      Result Value   WBC 17.1 (*)    All other components within normal limits  BASIC METABOLIC PANEL - Abnormal; Notable for the following components:   Glucose, Bld 125 (*)    BUN 22 (*)    All other components within normal limits  SURGICAL PCR SCREEN  APTT  PROTIME-INR  HIV ANTIBODY (ROUTINE TESTING W REFLEX)  TYPE AND SCREEN     EKG EKG independently reviewed interpreted by myself (ER attending) demonstrates:    RADIOLOGY Imaging independently reviewed and  interpreted by myself demonstrates:  Xray with L ankle fracture/dislocation  PROCEDURES:  Critical Care performed: No  .Sedation  Date/Time: 05/22/2022 9:15 AM  Performed by: Trinna Post, MD Authorized by: Trinna Post, MD   Consent:    Consent obtained:  Written   Consent given by:  Patient   Risks discussed:  Prolonged sedation necessitating reversal, respiratory compromise necessitating ventilatory assistance and intubation, inadequate sedation, vomiting, allergic reaction, prolonged hypoxia resulting in organ damage, dysrhythmia and nausea   Alternatives discussed:  Analgesia without sedation Universal protocol:    Procedure explained and questions answered to patient or proxy's satisfaction: yes     Immediately prior to procedure, a time out was called: yes   Indications:    Procedure performed:  Fracture reduction    Procedure necessitating sedation performed by:  Physician performing sedation Pre-sedation assessment:    Time since last food or drink:  >8 hours   ASA classification: class 2 - patient with mild systemic disease     Mouth opening:  3 or more finger widths   Mallampati score:  II - soft palate, uvula, fauces visible   Neck mobility: normal     Pre-sedation assessments completed and reviewed: airway patency, cardiovascular function, hydration status, mental status, nausea/vomiting, pain level, respiratory function and temperature   Immediate pre-procedure details:    Reassessment: Patient reassessed immediately prior to procedure     Reviewed: vital signs, relevant labs/tests and NPO status     Verified: bag valve mask available, emergency equipment available, intubation equipment available, IV patency confirmed, oxygen available and suction available   Procedure details (see MAR for exact dosages):    Preoxygenation:  Nasal cannula   Sedation:  Propofol   Intended level of sedation: moderate (conscious sedation)   Analgesia:  Fentanyl   Intra-procedure monitoring:  Blood pressure monitoring, continuous capnometry, frequent LOC assessments, cardiac monitor, continuous pulse oximetry and frequent vital sign checks   Intra-procedure events: none     Intra-procedure management:  Fluid bolus   Total Provider sedation time (minutes):  30 Reduction of fracture  Date/Time: 05/22/2022 4:10 PM  Performed by: Trinna Post, MD Authorized by: Trinna Post, MD  Consent: Written consent obtained. Consent given by: patient Patient understanding: patient states understanding of the procedure being performed Patient consent: the patient's understanding of the procedure matches consent given Procedure consent: procedure consent matches procedure scheduled Relevant documents: relevant documents present and verified Test results: test results available and properly labeled Imaging studies: imaging studies  available Required items: required blood products, implants, devices, and special equipment available Patient identity confirmed: verbally with patient Time out: Immediately prior to procedure a "time out" was called to verify the correct patient, procedure, equipment, support staff and site/side marked as required.  Sedation: Patient sedated: yes Sedation type: moderate (conscious) sedation Sedatives: propofol Vitals: Vital signs were monitored during sedation.  Patient tolerance: patient tolerated the procedure well with no immediate complications Comments: Left ankle fracture dislocation reduction Please see nursing documentation of sedation start and stop times     MEDICATIONS ORDERED IN ED: Medications  HYDROmorphone (DILAUDID) injection 1 mg ( Intravenous MAR Hold 05/22/22 1308)  oxyCODONE-acetaminophen (PERCOCET/ROXICET) 5-325 MG per tablet 1 tablet ( Oral MAR Hold 05/22/22 1308)  methocarbamol (ROBAXIN) tablet 500 mg ( Oral MAR Hold 05/22/22 1308)  ondansetron (ZOFRAN) injection 4 mg ( Intravenous MAR Hold 05/22/22 1308)  acetaminophen (TYLENOL) tablet 650 mg ( Oral MAR Hold 05/22/22 1308)  nicotine (NICODERM CQ - dosed in mg/24 hours)  patch 21 mg ( Transdermal Automatically Held 05/30/22 1000)  0.9 %  sodium chloride infusion ( Intravenous New Bag/Given 05/22/22 1027)  albuterol (PROVENTIL) (2.5 MG/3ML) 0.083% nebulizer solution 3 mL ( Inhalation MAR Hold 05/22/22 1308)  dextromethorphan-guaiFENesin (MUCINEX DM) 30-600 MG per 12 hr tablet 1 tablet ( Oral MAR Hold 05/22/22 1308)  senna-docusate (Senokot-S) tablet 1 tablet ( Oral MAR Hold 05/22/22 1308)  lisinopril (ZESTRIL) tablet 10 mg ( Oral Automatically Held 05/30/22 1000)  ALPRAZolam (XANAX) tablet 0.25 mg ( Oral MAR Hold 05/22/22 1308)  pantoprazole (PROTONIX) EC tablet 40 mg ( Oral Automatically Held 05/30/22 1000)  mometasone-formoterol (DULERA) 200-5 MCG/ACT inhaler 2 puff ( Inhalation Automatically Held 05/30/22 2000)   hydrALAZINE (APRESOLINE) injection 5 mg ( Intravenous MAR Hold 05/22/22 1308)  mupirocin ointment (BACTROBAN) 2 % 1 Application ( Nasal Automatically Held 05/26/22 2200)  0.9 % irrigation (POUR BTL) (1,000 mLs Irrigation Given 05/22/22 1404)  bupivacaine(PF) (MARCAINE) 0.5 % injection (30 mLs Infiltration Given 05/22/22 1558)  fentaNYL (SUBLIMAZE) injection 50 mcg (50 mcg Intravenous Given 05/22/22 0843)  propofol (DIPRIVAN) 10 mg/mL bolus/IV push 40.8 mg (40.8 mg Intravenous Given 05/22/22 1021)  ondansetron (ZOFRAN) injection 4 mg (4 mg Intravenous Given 05/22/22 1007)  propofol (DIPRIVAN) 10 mg/mL bolus/IV push (20 mg Intravenous Given 05/22/22 1024)  ceFAZolin (ANCEF) IVPB 2g/100 mL premix (2 g Intravenous Given 05/22/22 1324)     IMPRESSION / MDM / ASSESSMENT AND PLAN / ED COURSE  I reviewed the triage vital signs and the nursing notes.  Differential diagnosis includes, but is not limited to, ankle fracture, dislocation, soft tissue injury, no evidence of neurovascular compromise on exam  Patient's presentation is most consistent with acute presentation with potential threat to life or bodily function.  57 year old female presenting after a fall with a left ankle deformity.  Will obtain x-rays, provide as needed pain medication.  Will likely require reduction.  Clinical Course as of 05/22/22 1611  Sat May 22, 2022  1610 Case reviewed with Dr. Martha Clan with orthopedics.  He recommends splinting in the ER, with anticipated admission for surgical management, possibly later today versus tomorrow.  Also recommends hospitalist consult for medical clearance.  Patient consented for propofol sedation and bedside reduction. [NR]    Clinical Course User Index [NR] Trinna Post, MD     FINAL CLINICAL IMPRESSION(S) / ED DIAGNOSES   Final diagnoses:  Closed left ankle fracture     Rx / DC Orders   ED Discharge Orders     None        Note:  This document was prepared using Dragon voice  recognition software and may include unintentional dictation errors.   Trinna Post, MD 05/22/22 [id number]    Trinna Post, MD 06/22/22 [phone]

## 2022-05-22 NOTE — Anesthesia Procedure Notes (Signed)
Procedure Name: LMA Insertion Date/Time: 05/22/2022 1:17 PM  Performed by: Stormy Fabian, CRNAPre-anesthesia Checklist: Patient identified, Patient being monitored, Timeout performed, Emergency Drugs available and Suction available Patient Re-evaluated:Patient Re-evaluated prior to induction Oxygen Delivery Method: Circle system utilized Preoxygenation: Pre-oxygenation with 100% oxygen Induction Type: IV induction Ventilation: Mask ventilation without difficulty LMA: LMA inserted LMA Size: 4.0 Tube type: Oral Number of attempts: 1 Placement Confirmation: positive ETCO2 and breath sounds checked- equal and bilateral Tube secured with: Tape Dental Injury: Teeth and Oropharynx as per pre-operative assessment

## 2022-05-22 NOTE — Anesthesia Preprocedure Evaluation (Signed)
Anesthesia Evaluation  Patient identified by MRN, date of birth, ID band Patient awake    Reviewed: Allergy & Precautions, NPO status , Patient's Chart, lab work & pertinent test results  Airway Mallampati: III  TM Distance: >3 FB Neck ROM: full    Dental  (+) Dental Advidsory Given, Missing, Poor Dentition   Pulmonary neg shortness of breath, asthma , former smoker   Pulmonary exam normal        Cardiovascular hypertension, (-) angina (-) Past MI and (-) CABG negative cardio ROS Normal cardiovascular exam     Neuro/Psych  PSYCHIATRIC DISORDERS Anxiety     negative neurological ROS     GI/Hepatic Neg liver ROS,GERD  Medicated,,  Endo/Other  negative endocrine ROS    Renal/GU      Musculoskeletal   Abdominal   Peds  Hematology negative hematology ROS (+)   Anesthesia Other Findings Past Medical History: No date: Asthma No date: GERD (gastroesophageal reflux disease) No date: HTN (hypertension) No date: Tobacco abuse  Past Surgical History: 2001: CHOLECYSTECTOMY  BMI    Body Mass Index: 30.90 kg/m      Reproductive/Obstetrics negative OB ROS                             Anesthesia Physical Anesthesia Plan  ASA: 2  Anesthesia Plan: General   Post-op Pain Management:    Induction: Intravenous  PONV Risk Score and Plan: 3 and Ondansetron, Dexamethasone and Midazolam  Airway Management Planned: LMA  Additional Equipment:   Intra-op Plan:   Post-operative Plan: Extubation in OR  Informed Consent: I have reviewed the patients History and Physical, chart, labs and discussed the procedure including the risks, benefits and alternatives for the proposed anesthesia with the patient or authorized representative who has indicated his/her understanding and acceptance.     Dental Advisory Given  Plan Discussed with: Anesthesiologist, CRNA and Surgeon  Anesthesia Plan Comments:  (Patient consented for risks of anesthesia including but not limited to:  - adverse reactions to medications - damage to eyes, teeth, lips or other oral mucosa - nerve damage due to positioning  - sore throat or hoarseness - Damage to heart, brain, nerves, lungs, other parts of body or loss of life  Patient voiced understanding.)       Anesthesia Quick Evaluation

## 2022-05-22 NOTE — Op Note (Signed)
05/22/2022  4:57 PM  PATIENT:  Vickie Hamilton    PRE-OPERATIVE DIAGNOSIS:  Left trimalleolar ankle fracture dislocation  POST-OPERATIVE DIAGNOSIS:  Same  PROCEDURE:  OPEN REDUCTION INTERNAL FIXATION (ORIF) LEFT TRIMALLEOLAR ANKLE FRACTURE  SURGEON:  Juanell Fairly, MD  ANESTHESIA:   General  Tourniquet time:  130 minutes  PREOPERATIVE INDICATIONS:  Vickie Hamilton is a  57 y.o. female with a diagnosis of Left trimalleolar ankle fracture dislocation after a fall on wet stairs earlier today.  A closed reduction was performed of the lateral ankle dislocation in the ER.  Given the unstable nature of the patient's fracture pattern reduction internal fixation for the left trimalleolar ankle fracture was recommended.  I discussed the risks and benefits of surgery. The risks include but are not limited to infection, bleeding requiring blood transfusion, nerve or blood vessel injury, joint stiffness or loss of motion, persistent pain, weakness or instability, malunion, nonunion and hardware failure and the need for further surgery.  Patient understood these risks and wished to proceed.   OPERATIVE IMPLANTS: Synthes 10 hole one third tubular plate for lateral fixation.  Synthes 4.0 mm cannulated screws x 2 for medial fixation.  Arthrex stainless steel ankle tight rope for syndesmotic stabilization.  OPERATIVE FINDINGS: Trimalleolar ankle fracture dislocation.  OPERATIVE PROCEDURE:   Patient was met in the preoperative area with her daughter at the bedside. The left leg was signed my initials and the word yes according the hospital's correct site of surgery protocol.  I reviewed the details of the operation as well as the postoperative course with the patient. The patient was brought to the operating room where she underwent general anesthesia. The patient was placed supine on the operative table. A bump was placed under the left hip. A tourniquet was applied to the left thigh.  The left lower  extremity was prepped and draped in a sterile fashion. A timeout was performed to verify the patient's name, date of birth, medical record number, correct site of surgery and correct procedure to be performed. It was also used to verify the patient received antibiotics, and that all appropriate instruments, implants and radiographic studies were available in the room. Once all in attendance were in agreement, the case began.  The left lower extremity was exsanguinated with an Esmarch. The tourniquet was inflated to 275 mmHg 130 minutes. This was applied for a total of 130 minutes. A long lateral incision was made over the fibula. The subcutaneous tissues were dissected with the Metzenbaum scissor and pickup. Care was taken to avoid injury to the superficial peroneal nerve. The fibula fracture was identified and irrigated and fracture hematoma was removed. The patient had significant comminution of the fibula including a significant butterfly fragment posteriorly. Soft tissue was removed from the fracture site using a periosteal elevator.  The fracture site was copiously irrigated.  Two fracture reduction clamps were then used to reduce the fracture to an anatomic position.  The fracture was then stabilized with two 3.5 mm self-tapping bicortical lag screws, one in oblique direction and one in an anterior to posterior direction stabilizing the comminuted fragments.  These screws compressed the fracture.   A 10 hole, 1/3 tubular plate was then contoured and placed along the lateral fibula. Bicortical screws were placed proximal to the fracture and fully threaded cancellus screws were placed distal the fracture. The fracture reduction and hardware placement were confirmed on AP and lateral FluoroScan imaging.  Once the lateral malleolus was plated, the attention was  turned to the medial ankle. A vertical incision was made over the tip of the medial malleolus.  Soft tissue was dissected with some with the Metzenbaum  scissor and pickup. The fracture of the medial malleolus was identified and also found to be comminuted including a displaced fragment superior to the medial malleolus.  The main medial malleolus fracture was reduced with a dental pick and the small displaced fragment was also reduced to its original position. Two smooth K wires for 4.0 cannulated screws were then advanced through the tip of the medial malleolus across the fracture site and into the distal tibia. The position of the K wires was evaluated on AP and lateral FluoroScan images. The length of the wires were measured with a depth gauge and were both determined to be 44 mm in length. The wires were then overdrilled with a cannulated drill for the 4.0 cannulated screws. The 44mm long threaded 4.0 cannulated screws were then advanced into position by hand, compressing the medial malleolus fracture.    The posterior malleolus was then examined under fluoroscopy. It was felt to be less than 5-10% of the articular surface and was in a near anatomic position. The decision was made made not to place an AP screw given its stability and small size.  A stress test of the right ankle was then performed under fluoroscopy.  This test did not reveal any gross instability, however given the fracture pattern, there was concern for syndesmotic injury.  The decision was therefore made to place a Arthrex tight rope to stabilize the syndesmosis.  A K wire included In the Syndesmotic was placed through the lateral plate and across the tibia through all 4 cortices.  The position was confirmed on FluoroScan imaging.  This was then overdrilled with a cannulated drill for the tight rope.  Once the drill hole was created a stainless steel Arthrex tight rope was passed through the tunnel.  The button along the medial cortex of the tibia was flipped.  Counter tension was applied and the button on the fibular side was reduced to provide syndesmotic support.  Final fluoroscan images  were taken of the construct both the AP and lateral planes.  A mortise view was also examined.  The fractures were well reduced.  The mortise was symmetric and there is no widening the syndesmosis.  The medial and lateral incisions were then copiously irrigated. The subcutaneous tissue was closed with 2-0 Vicryl and the skin approximated staples.  30 cc of 0.25% Marcaine plain was injected into the medial and lateral incisions as well as into the left ankle joint.  A dry sterile dressing was applied along with an AO splint. The patient's ankle was positioned in neutral. The pateint was then awoken from anesthesia, transferred to hospital bed and brought to the PACU in stable condition. I was scrubbed and present the entire case and all sharp and instrument counts were correct at conclusion the case. I spoke to the patient's family in the post-op consultation room to let them know the case was performed without complication and the patient was stable in recovery room.    Vickie Devoid, MD

## 2022-05-22 NOTE — H&P (Signed)
History and Physical    Vickie Hamilton JXB:[id number] DOB: 06/06/1965 DOA: 05/22/2022  Referring MD/NP/PA:   PCP: [facility]   Patient coming from:  The patient is coming from home.     Chief Complaint: fall and left ankle pain  HPI: Vickie Hamilton is a 57 y.o. female with medical history significant of asthma, GERD, obesity, former smoker, who presents with fall and left ankle pain.  Patient states that she accidentally fell when she was walking down the stair, tripped over steps at about 730 this morning.  No loss of consciousness.  Strongly denies any head or neck injury.  No headache or neck pain.  She does not want to do CT scan of head and neck.  She developed pain in left ankle which has obvious deformity.  The pain is constant, sharp, severe, nonradiating, aggravated by movement.  Patient denies chest pain, cough, shortness of breath.  No fever or chills.  Denies nausea, vomiting, diarrhea or abdominal pain.  No symptoms of UTI.  Patient states that she quit smoking 4 years ago.   Data reviewed independently and ED Course: pt was found to have WBC 17.1, electrolytes renal function okay, INR 1.1, PTT 26. Temperature normal, blood pressure 126/67, heart rate 66, RR 18, oxygen saturation 95% on room air.  Patient is admitted to MedSurg bed as inpatient.  Dr. Martha Clan of Ortho is consulted.  X-ray of left foot 1. Fracture dislocation of the distal tibia and fibula with 40 degrees angulation of a comminuted fibular fracture. 2. Avulsion of the medial malleolus.  X-ray of left ankle 1. Reduction of comminuted distal fibular and medial malleolar fractures. 2. Minimally displaced posterior malleolar fracture.  EKG: I have personally reviewed.  Sinus rhythm, QTc 456, no ischemic change.   Review of Systems:   General: no fevers, chills, no body weight gain, fatigue HEENT: no blurry vision, hearing changes or sore throat Respiratory: no dyspnea, coughing, wheezing CV:  no chest pain, no palpitations GI: no nausea, vomiting, abdominal pain, diarrhea, constipation GU: no dysuria, burning on urination, increased urinary frequency, hematuria  Ext: no leg edema Neuro: no unilateral weakness, numbness, or tingling, no vision change or hearing loss. Has fall Skin: no rash, no skin tear. MSK: has pain and deformity left ankle Heme: No easy bruising.  Travel history: No recent long distant travel.   Allergy:  Allergies  Allergen Reactions   Asa [Aspirin] Other (See Comments)    Reaction : Stomach irritation, GI upset   Naproxen     Reaction : Stomach irritation, GI upset    Past Medical History:  Diagnosis Date   Asthma    GERD (gastroesophageal reflux disease)    HTN (hypertension)    Tobacco abuse     Past Surgical History:  Procedure Laterality Date   CHOLECYSTECTOMY  2001    Social History:  reports that she has quit smoking. Her smoking use included cigarettes. She smoked an average of .5 packs per day. She has never used smokeless tobacco. She reports that she does not drink alcohol and does not use drugs.  Family History: History reviewed. No pertinent family history.   Prior to Admission medications   Medication Sig Start Date End Date Taking? Authorizing Provider  albuterol (PROVENTIL HFA;VENTOLIN HFA) 108 (90 Base) MCG/ACT inhaler Inhale 4-6 puffs by mouth every 4 hours as needed for wheezing, cough, and/or shortness of breath 02/04/16   Loleta Rose, MD  budesonide-formoterol [facility]) 80-4.5 MCG/ACT inhaler Inhale 2  puffs into the lungs 2 (two) times daily. 07/29/20   Sharyn Creamer, MD  chlorpheniramine-HYDROcodone (TUSSIONEX PENNKINETIC ER) 10-8 MG/5ML SUER Take 5 mLs by mouth 2 (two) times daily. 01/02/15   Joni Reining, PA-C  ondansetron (ZOFRAN ODT) 4 MG disintegrating tablet Take 1 tablet (4 mg total) by mouth every 6 (six) hours as needed for nausea or vomiting. 07/29/20   Sharyn Creamer, MD  pantoprazole (PROTONIX) 40 MG tablet  Take 1 tablet (40 mg total) by mouth daily for 14 days. 07/30/21 08/13/21  Gilles Chiquito, MD  sucralfate (CARAFATE) 1 g tablet Take 1 tablet (1 g total) by mouth 4 (four) times daily for 3 days. 07/30/21 08/02/21  Gilles Chiquito, MD    Physical Exam: Vitals:   05/22/22 1030 05/22/22 1035 05/22/22 1040 05/22/22 1101  BP: 130/76 114/80 130/67 135/71  Pulse: 68 60 60 66  Resp: 14 12 13 18   Temp:    97.8 F (36.6 C)  TempSrc:      SpO2: 100% 100% 100% 97%  Weight:      Height:       General: Not in acute distress HEENT:       Eyes: PERRL, EOMI, no scleral icterus.       ENT: No discharge from the ears and nose, no pharynx injection, no tonsillar enlargement.        Neck: No JVD, no bruit, no mass felt. Heme: No neck lymph node enlargement. Cardiac: S1/S2, RRR, No murmurs, No gallops or rubs. Respiratory: No rales, wheezing, rhonchi or rubs. GI: Soft, nondistended, nontender, no rebound pain, no organomegaly, BS present. GU: No hematuria Ext: No pitting leg edema bilaterally. 1+DP/PT pulse bilaterally. Musculoskeletal: has tenderness and deformity in the left ankle Skin: No rashes.  Neuro: Alert, oriented X3, cranial nerves II-XII grossly intact, moves all extremities normally Psych: Patient is not psychotic, no suicidal or hemocidal ideation.  Labs on Admission: I have personally reviewed following labs and imaging studies  CBC: Recent Labs  Lab 05/22/22 1104  WBC 17.1*  HGB 14.3  HCT 42.5  MCV 87.1  PLT 365   Basic Metabolic Panel: Recent Labs  Lab 05/22/22 1104  NA 137  K 3.5  CL 101  CO2 27  GLUCOSE 125*  BUN 22*  CREATININE 0.89  CALCIUM 9.0   GFR: Estimated Creatinine Clearance: 72.1 mL/min (by C-G formula based on SCr of 0.89 mg/dL). Liver Function Tests: No results for input(s): "AST", "ALT", "ALKPHOS", "BILITOT", "PROT", "ALBUMIN" in the last 168 hours. No results for input(s): "LIPASE", "AMYLASE" in the last 168 hours. No results for input(s):  "AMMONIA" in the last 168 hours. Coagulation Profile: Recent Labs  Lab 05/22/22 1104  INR 1.1   Cardiac Enzymes: No results for input(s): "CKTOTAL", "CKMB", "CKMBINDEX", "TROPONINI" in the last 168 hours. BNP (last 3 results) No results for input(s): "PROBNP" in the last 8760 hours. HbA1C: No results for input(s): "HGBA1C" in the last 72 hours. CBG: No results for input(s): "GLUCAP" in the last 168 hours. Lipid Profile: No results for input(s): "CHOL", "HDL", "LDLCALC", "TRIG", "CHOLHDL", "LDLDIRECT" in the last 72 hours. Thyroid Function Tests: No results for input(s): "TSH", "T4TOTAL", "FREET4", "T3FREE", "THYROIDAB" in the last 72 hours. Anemia Panel: No results for input(s): "VITAMINB12", "FOLATE", "FERRITIN", "TIBC", "IRON", "RETICCTPCT" in the last 72 hours. Urine analysis:    Component Value Date/Time   COLORURINE YELLOW (A) 07/29/2020 1331   APPEARANCEUR HAZY (A) 07/29/2020 1331   LABSPEC 1.024 07/29/2020 1331  PHURINE 5.0 07/29/2020 1331   GLUCOSEU NEGATIVE 07/29/2020 1331   HGBUR SMALL (A) 07/29/2020 1331   BILIRUBINUR NEGATIVE 07/29/2020 1331   KETONESUR NEGATIVE 07/29/2020 1331   PROTEINUR NEGATIVE 07/29/2020 1331   NITRITE NEGATIVE 07/29/2020 1331   LEUKOCYTESUR NEGATIVE 07/29/2020 1331   Sepsis Labs: @LABRCNTIP (procalcitonin:4,lacticidven:4) )No results found for this or any previous visit (from the past 240 hour(s)).   Radiological Exams on Admission: DG Ankle Complete Left  Result Date: 05/22/2022 CLINICAL DATA:  post reduction EXAM: LEFT ANKLE COMPLETE - 3+ VIEW COMPARISON:  Earlier films of the same day FINDINGS: Comminuted distal fibular shaft fracture fragments with reduction of previous angulation deformity. Significant reduction of the comminuted medial malleolar fracture deformity. The tibiotalar alignment appears grossly normal. Minimally displaced posterior malleolar fracture. Medial soft tissue swelling. Calcaneal spurs. IMPRESSION: 1. Reduction  of comminuted distal fibular and medial malleolar fractures. 2. Minimally displaced posterior malleolar fracture. Electronically Signed   By: Corlis Leak M.D.   On: 05/22/2022 10:37   DG Ankle Complete Left  Result Date: 05/22/2022 CLINICAL DATA:  Fall. Ankle pain and deformity. Fell down steps this morning. EXAM: LEFT FOOT - COMPLETE 3+ VIEW; LEFT ANKLE COMPLETE - 3+ VIEW COMPARISON:  None. FINDINGS: Fracture dislocation of the distal tibia and fibula is present with 40 degrees angulation of a comminuted fibular fracture. Avulsion of the medial malleolus is present. Fibular fracture is comminuted. Talus is displaced laterally relative to the distal tibia. No additional fractures are present foot. Mild degenerative changes are present midfoot. Prominent calcaneal spur is noted. IMPRESSION: 1. Fracture dislocation of the distal tibia and fibula with 40 degrees angulation of a comminuted fibular fracture. 2. Avulsion of the medial malleolus. Electronically Signed   By: Marin Roberts M.D.   On: 05/22/2022 08:34   DG Foot Complete Left  Result Date: 05/22/2022 CLINICAL DATA:  Fall. Ankle pain and deformity. Fell down steps this morning. EXAM: LEFT FOOT - COMPLETE 3+ VIEW; LEFT ANKLE COMPLETE - 3+ VIEW COMPARISON:  None. FINDINGS: Fracture dislocation of the distal tibia and fibula is present with 40 degrees angulation of a comminuted fibular fracture. Avulsion of the medial malleolus is present. Fibular fracture is comminuted. Talus is displaced laterally relative to the distal tibia. No additional fractures are present foot. Mild degenerative changes are present midfoot. Prominent calcaneal spur is noted. IMPRESSION: 1. Fracture dislocation of the distal tibia and fibula with 40 degrees angulation of a comminuted fibular fracture. 2. Avulsion of the medial malleolus. Electronically Signed   By: Marin Roberts M.D.   On: 05/22/2022 08:34      Assessment/Plan Principal Problem:   Closed left  ankle fracture Active Problems:   Fall at home, initial encounter   GERD (gastroesophageal reflux disease)   HTN (hypertension)   Asthma   Leukocytosis   Obesity (BMI 30-39.9)   Assessment and Plan: Patient has severe pain now. No neurovascular compromise. Orthopedic surgeon, Dr. Martha Clan was consulted. X-ray showed fracture dislocation of the distal tibia and fibula with 40 degrees angulation of a comminuted fibular fracture and avulsion of the medial malleolus.  - will admit to Med-surg bed - Pain control: prn dilaurid, percocet and tyleno - When necessary Zofran for nausea - Robaxin for muscle spasm - Lidoderm patch for pain - type and cross - INR/PTT  Fall at home, initial encounter: -PT/OT when able to (not ordered now)  GERD (gastroesophageal reflux disease) -Protonix, Carafate  HTN: Blood pressure 126/67 -IV hydralazine as needed -Continue lisinopril -Hold Hyzaar  while patient is n.p.o.  Asthma: stable -Bronchodilators and as needed Mucinex  Leukocytosis: WBC 17.1. Likely due to stress-induced demargination. Patient does not have signs of infection. -Follow-up CBC  Obesity (BMI 30-39.9): Body weight 85.6 kg, BMI 30.90 -Encouraged losing weight -Exercise and healthy diet      Perioperative Cardiac Risk: pt has PMH as listed above, but no history of cardiac issues. Currently patient is active and independent of ADLs and, IADLs. No recent acute cardiac issues.  Patient does not have chest pain, shortness of breath, palpitation, leg edema.  No signs of acute CHF currently.  At this time point, no further work up is needed. Patient's GUPTA score perioperative myocardial infarction or cardaic arrest is 0.13% which is low risk.   DVT ppx: SCD  Code Status: Full code  Family Communication:  Yes, patient's daughter  by phone  Disposition Plan:  Anticipate discharge back to previous environment  Consults called:  Dr. Martha Clan of Ortho is consulted.  Admission  status and Level of care: Med-Surg:  as inpt     Dispo: The patient is from: Home              Anticipated d/c is to: Home              Anticipated d/c date is: 2 days              Patient currently is not medically stable to d/c.    Severity of Illness:  The appropriate patient status for this patient is INPATIENT. Inpatient status is judged to be reasonable and necessary in order to provide the required intensity of service to ensure the patient's safety. The patient's presenting symptoms, physical exam findings, and initial radiographic and laboratory data in the context of their chronic comorbidities is felt to place them at high risk for further clinical deterioration. Furthermore, it is not anticipated that the patient will be medically stable for discharge from the hospital within 2 midnights of admission.   * I certify that at the point of admission it is my clinical judgment that the patient will require inpatient hospital care spanning beyond 2 midnights from the point of admission due to high intensity of service, high risk for further deterioration and high frequency of surveillance required.*       Date of Service 05/22/2022    Lorretta Harp [facility]   If 7PM-7AM, please contact night-coverage [email or web address] 05/22/2022, 11:39 AM

## 2022-05-22 NOTE — Transfer of Care (Signed)
Immediate Anesthesia Transfer of Care Note  Patient: Vickie Hamilton  Procedure(s) Performed: Procedure(s): OPEN REDUCTION INTERNAL FIXATION (ORIF) ANKLE FRACTURE (Left)  Patient Location: PACU  Anesthesia Type:General  Level of Consciousness: sedated  Airway & Oxygen Therapy: Patient Spontanous Breathing and Patient connected to face mask oxygen  Post-op Assessment: Report given to RN and Post -op Vital signs reviewed and stable  Post vital signs: Reviewed and stable  Last Vitals:  Vitals:   05/22/22 1101 05/22/22 1621  BP: 135/71 (!) 147/82  Pulse: 66 88  Resp: 18 11  Temp: 36.6 C (P) 36.5 C  SpO2: 97% 100%    Complications: No apparent anesthesia complications

## 2022-05-22 NOTE — Anesthesia Postprocedure Evaluation (Signed)
Anesthesia Post Note  Patient: Vickie Hamilton  Procedure(s) Performed: OPEN REDUCTION INTERNAL FIXATION (ORIF) ANKLE FRACTURE (Left: Ankle)  Patient location during evaluation: PACU Anesthesia Type: General Level of consciousness: awake and alert Pain management: pain level controlled Vital Signs Assessment: post-procedure vital signs reviewed and stable Respiratory status: spontaneous breathing, nonlabored ventilation, respiratory function stable and patient connected to nasal cannula oxygen Cardiovascular status: blood pressure returned to baseline and stable Postop Assessment: no apparent nausea or vomiting Anesthetic complications: no  No notable events documented.   Last Vitals:  Vitals:   05/22/22 1621 05/22/22 1630  BP: (!) 147/82 130/80  Pulse: 88 81  Resp: 11 12  Temp: 36.5 C   SpO2: 100% 100%    Last Pain:  Vitals:   05/22/22 1018  TempSrc:   PainSc: 10-Worst pain ever                 [employer]

## 2022-05-22 NOTE — ED Triage Notes (Signed)
Pt via [facility] from home. Pt had a mechanical fall this AM. Pt fell down 2-3 steps. Pt c/o L ankle pain. Obvious deformity to the L ankle. Pt is A&OX4 and NAD EMS gave of Fentanyl

## 2022-05-22 NOTE — Transfer of Care (Deleted)
Immediate Anesthesia Transfer of Care Note  Patient: Vickie Hamilton  Procedure(s) Performed: Procedure(s): OPEN REDUCTION INTERNAL FIXATION (ORIF) ANKLE FRACTURE (Left)  Patient Location: PACU  Anesthesia Type:General  Level of Consciousness: sedated  Airway & Oxygen Therapy: Patient Spontanous Breathing and Patient connected to face mask oxygen  Post-op Assessment: Report given to RN and Post -op Vital signs reviewed and stable  Post vital signs: Reviewed and stable  Last Vitals:  Vitals:   05/22/22 1101 05/22/22 1621  BP: 135/71   Pulse: 66   Resp: 18   Temp: 36.6 C (P) 36.5 C  SpO2: 97%     Complications: No apparent anesthesia complications

## 2022-05-22 NOTE — Consult Note (Signed)
ORTHOPAEDIC CONSULTATION  REQUESTING PHYSICIAN: Lorretta Harp, MD  Chief Complaint: Left ankle fracture status post fall  HPI: Vickie Hamilton is a 57 y.o. female who complains of left ankle fracture after slip and falling on wet stairs this morning.  Patient had immediate pain and deformity of the left ankle after her fall and was unable to bear weight.  Patient was brought to the [facility] emergency department by EMS.  X-rays in the emergency department revealed a trimalleolar ankle fracture dislocation.  A closed reduction was performed by the emergency room physician and the patient was splinted.  Postreduction x-rays reveal near anatomic alignment.  Patient denies other injuries.  Patient is not diabetic and states she quit smoking approximately 4 years ago.  Past Medical History:  Diagnosis Date   Asthma    GERD (gastroesophageal reflux disease)    HTN (hypertension)    Tobacco abuse    Past Surgical History:  Procedure Laterality Date   CHOLECYSTECTOMY  2001   Social History   Socioeconomic History   Marital status: Married    Spouse name: Not on file   Number of children: Not on file   Years of education: Not on file   Highest education level: Not on file  Occupational History   Not on file  Tobacco Use   Smoking status: Former    Packs/day: .5    Types: Cigarettes   Smokeless tobacco: Never  Substance and Sexual Activity   Alcohol use: No    Comment: occassional   Drug use: No   Sexual activity: Not on file  Other Topics Concern   Not on file  Social History Narrative   Not on file   Social Determinants of Health   Financial Resource Strain: Not on file  Food Insecurity: No Food Insecurity (05/22/2022)   Hunger Vital Sign    Worried About Running Out of Food in the Last Year: Never true    Ran Out of Food in the Last Year: Never true  Transportation Needs: No Transportation Needs (05/22/2022)   PRAPARE - Administrator, Civil Service  (Medical): No    Lack of Transportation (Non-Medical): No  Physical Activity: Not on file  Stress: Not on file  Social Connections: Not on file   History reviewed. No pertinent family history. Allergies  Allergen Reactions   Asa [Aspirin] Other (See Comments)    Reaction : Stomach irritation, GI upset   Naproxen     Reaction : Stomach irritation, GI upset   Prior to Admission medications   Medication Sig Start Date End Date Taking? Authorizing Provider  ALPRAZolam (XANAX) 0.25 MG tablet Take 0.25 mg by mouth at bedtime as needed. 04/19/22  Yes [provider]  budesonide-formoterol (SYMBICORT) 80-4.5 MCG/ACT inhaler Inhale 2 puffs into the lungs 2 (two) times daily. 07/29/20  Yes Sharyn Creamer, MD  fluticasone-salmeterol (ADVAIR) 500-50 MCG/ACT AEPB Inhale 1 puff into the lungs in the morning and at bedtime. 05/10/22 05/10/23 Yes [provider]  lisinopril (ZESTRIL) 10 MG tablet Take 10 mg by mouth daily. 03/04/22  Yes [provider]  losartan-hydrochlorothiazide (HYZAAR) 50-12.5 MG tablet Take 1 tablet by mouth daily. 04/19/22 04/19/23 Yes [provider]  ondansetron (ZOFRAN ODT) 4 MG disintegrating tablet Take 1 tablet (4 mg total) by mouth every 6 (six) hours as needed for nausea or vomiting. 07/29/20  Yes Sharyn Creamer, MD  albuterol (PROVENTIL HFA;VENTOLIN HFA) 108 (90 Base) MCG/ACT inhaler Inhale 4-6 puffs by mouth every  4 hours as needed for wheezing, cough, and/or shortness of breath Patient not taking: Reported on 05/22/2022 02/04/16   Loleta Rose, MD  chlorpheniramine-HYDROcodone [facility] PENNKINETIC ER) 10-8 MG/5ML SUER Take 5 mLs by mouth 2 (two) times daily. Patient not taking: Reported on 05/22/2022 01/02/15   Joni Reining, PA-C  pantoprazole (PROTONIX) 40 MG tablet Take 1 tablet (40 mg total) by mouth daily for 14 days. Patient not taking: Reported on 05/22/2022 07/30/21 08/13/21  Gilles Chiquito, MD  sucralfate (CARAFATE) 1 g tablet Take 1 tablet  (1 g total) by mouth 4 (four) times daily for 3 days. Patient not taking: Reported on 05/22/2022 07/30/21 08/02/21  Gilles Chiquito, MD   DG Ankle Complete Left  Result Date: 05/22/2022 CLINICAL DATA:  post reduction EXAM: LEFT ANKLE COMPLETE - 3+ VIEW COMPARISON:  Earlier films of the same day FINDINGS: Comminuted distal fibular shaft fracture fragments with reduction of previous angulation deformity. Significant reduction of the comminuted medial malleolar fracture deformity. The tibiotalar alignment appears grossly normal. Minimally displaced posterior malleolar fracture. Medial soft tissue swelling. Calcaneal spurs. IMPRESSION: 1. Reduction of comminuted distal fibular and medial malleolar fractures. 2. Minimally displaced posterior malleolar fracture. Electronically Signed   By: Corlis Leak M.D.   On: 05/22/2022 10:37   DG Ankle Complete Left  Result Date: 05/22/2022 CLINICAL DATA:  Fall. Ankle pain and deformity. Fell down steps this morning. EXAM: LEFT FOOT - COMPLETE 3+ VIEW; LEFT ANKLE COMPLETE - 3+ VIEW COMPARISON:  None. FINDINGS: Fracture dislocation of the distal tibia and fibula is present with 40 degrees angulation of a comminuted fibular fracture. Avulsion of the medial malleolus is present. Fibular fracture is comminuted. Talus is displaced laterally relative to the distal tibia. No additional fractures are present foot. Mild degenerative changes are present midfoot. Prominent calcaneal spur is noted. IMPRESSION: 1. Fracture dislocation of the distal tibia and fibula with 40 degrees angulation of a comminuted fibular fracture. 2. Avulsion of the medial malleolus. Electronically Signed   By: Marin Roberts M.D.   On: 05/22/2022 08:34   DG Foot Complete Left  Result Date: 05/22/2022 CLINICAL DATA:  Fall. Ankle pain and deformity. Fell down steps this morning. EXAM: LEFT FOOT - COMPLETE 3+ VIEW; LEFT ANKLE COMPLETE - 3+ VIEW COMPARISON:  None. FINDINGS: Fracture dislocation of the  distal tibia and fibula is present with 40 degrees angulation of a comminuted fibular fracture. Avulsion of the medial malleolus is present. Fibular fracture is comminuted. Talus is displaced laterally relative to the distal tibia. No additional fractures are present foot. Mild degenerative changes are present midfoot. Prominent calcaneal spur is noted. IMPRESSION: 1. Fracture dislocation of the distal tibia and fibula with 40 degrees angulation of a comminuted fibular fracture. 2. Avulsion of the medial malleolus. Electronically Signed   By: Marin Roberts M.D.   On: 05/22/2022 08:34    Positive ROS: All other systems have been reviewed and were otherwise negative with the exception of those mentioned in the HPI and as above.  Physical Exam: General: Alert, no acute distress   MUSCULOSKELETAL: Left ankle: Patient has an AO splint in place.  ED reported a closed injury.  Compartments are soft and compressible.  She can flex and extend her toes.  Her toes are well-perfused and she has intact sensation to light touch of all 5 toes.  Assessment: Left malleolar ankle fracture dislocation  Plan: I explained to the patient that despite a good reduction from the emergency department, her fracture pattern  is unstable and may not maintain its current reduction.  I have recommended ORIF of the medial lateral malleolar fractures.  Patient has a small posterior malleolus fracture which will not require surgical fixation.  Patient may require syndesmotic fixation if the syndesmosis is unstable during surgery.  I reviewed the details of the operation as well as the postoperative course with the patient.  She understands that she will be nonweightbearing for 6 to 8 weeks postop.  I marked the left leg according to hospital's correct site of surgery protocol.  I discussed the risks and benefits of surgery. The risks include but are not limited to infection, bleeding, nerve or blood vessel injury, joint  stiffness or loss of motion, persistent pain, weakness or instability, malunion, nonunion and hardware failure and the need for further surgery. Patient understood these risks and wished to proceed.    Juanell Fairly, MD    05/22/2022 1:06 PM

## 2022-05-23 ENCOUNTER — Encounter: Payer: Self-pay | Admitting: Orthopedic Surgery

## 2022-05-23 DIAGNOSIS — D72829 Elevated white blood cell count, unspecified: Secondary | ICD-10-CM

## 2022-05-23 DIAGNOSIS — S82892A Other fracture of left lower leg, initial encounter for closed fracture: Secondary | ICD-10-CM | POA: Diagnosis not present

## 2022-05-23 LAB — BASIC METABOLIC PANEL
Anion gap: 11 (ref 5–15)
BUN: 20 mg/dL (ref 6–20)
CO2: 27 mmol/L (ref 22–32)
Calcium: 8.8 mg/dL — ABNORMAL LOW (ref 8.9–10.3)
Chloride: 100 mmol/L (ref 98–111)
Creatinine, Ser: 0.96 mg/dL (ref 0.44–1.00)
GFR, Estimated: >60 mL/min (ref >60)
Glucose, Bld: 130 mg/dL — ABNORMAL HIGH (ref 70–99)
Potassium: 3.6 mmol/L (ref 3.5–5.1)
Sodium: 138 mmol/L (ref 135–145)

## 2022-05-23 LAB — CBC
HCT: 39.7 % (ref 36.0–46.0)
Hemoglobin: 13.3 g/dL (ref 12.0–15.0)
MCH: 29.8 pg (ref 26.0–34.0)
MCHC: 33.5 g/dL (ref 30.0–36.0)
MCV: 89 fL (ref 80.0–100.0)
Platelets: 379 10*3/uL (ref 150–400)
RBC: 4.46 MIL/uL (ref 3.87–5.11)
RDW: 13.2 % (ref 11.5–15.5)
WBC: 17 10*3/uL — ABNORMAL HIGH (ref 4.0–10.5)
nRBC: 0 % (ref 0.0–0.2)

## 2022-05-23 LAB — GLUCOSE, CAPILLARY: Glucose-Capillary: 107 mg/dL — ABNORMAL HIGH (ref 70–99)

## 2022-05-23 MED ORDER — ENSURE ENLIVE PO LIQD: 237.0000 mL | ORAL | Status: DC

## 2022-05-23 MED ORDER — ADULT MULTIVITAMIN W/MINERALS CH
1.0000 | ORAL_TABLET | Freq: Every day | ORAL | Status: DC
  Administered 2022-05-23 – 2022-05-25 (×3): 1 via ORAL
  Filled 2022-05-23 (×3): qty 1

## 2022-05-23 MED ORDER — PROSOURCE PLUS PO LIQD
30.0000 mL | Freq: Every day | ORAL | Status: DC
  Administered 2022-05-23 – 2022-05-25 (×3): 30 mL via ORAL
  Filled 2022-05-23: qty 30

## 2022-05-23 NOTE — Evaluation (Signed)
Physical Therapy Evaluation Patient Details Name: Vickie Hamilton MRN: [id number] DOB: 1965/10/27 Today's Date: 05/23/2022  History of Present Illness  Vickie Hamilton is a 57 y.o. female with medical history significant of asthma, GERD, obesity, former smoker, who presents with fall and left ankle pain. Trimalleolar fracture s/p ORIF 05/22/22. NWB L LE.  Clinical Impression  Patient received in bed, she is agreeable to PT assessment. She has been getting up to the [facility] in room. Patient is independent with bed mobility. Transfers to [facility] with supervision. Transfers sit to stand with min guard. Ambulated 5 feet with RW and NWB L LE with min guard. Good upper body strength. She will continue to benefit from skilled PT to improve functional independence and safety with mobility.         Recommendations for follow up therapy are one component of a multi-disciplinary discharge planning process, led by the attending physician.  Recommendations may be updated based on patient status, additional functional criteria and insurance authorization.  Follow Up Recommendations       Assistance Recommended at Discharge Intermittent Supervision/Assistance  Patient can return home with the following  A little help with walking and/or transfers;A little help with bathing/dressing/bathroom;Assist for transportation;Help with stairs or ramp for entrance;Assistance with cooking/housework    Equipment Recommendations Rolling walker (2 wheels);BSC/3in1  Recommendations for Other Services       Functional Status Assessment Patient has had a recent decline in their functional status and demonstrates the ability to make significant improvements in function in a reasonable and predictable amount of time.     Precautions / Restrictions Precautions Precautions: Fall Splint/Cast - Date Prophylactic Dressing Applied (if applicable): 05/22/22 Restrictions Weight Bearing Restrictions: Yes LLE Weight Bearing: Non weight  bearing      Mobility  Bed Mobility Overal bed mobility: Independent                  Transfers Overall transfer level: Needs assistance Equipment used: None, Rolling walker (2 wheels) Transfers: Sit to/from Stand, Bed to chair/wheelchair/BSC Sit to Stand: Supervision     Squat pivot transfers: Supervision     General transfer comment: patient demonstrates excellent ability with transfers    Ambulation/Gait Ambulation/Gait assistance: Min guard Gait Distance (Feet): 5 Feet Assistive device: Rolling walker (2 wheels) Gait Pattern/deviations: Step-to pattern Gait velocity: decr     General Gait Details: patient able to hop using RW and min guard.  Stairs            Wheelchair Mobility    Modified Rankin (Stroke Patients Only)       Balance Overall balance assessment: Needs assistance Sitting-balance support: Feet supported Sitting balance-Leahy Scale: Normal     Standing balance support: Bilateral upper extremity supported, During functional activity, Reliant on assistive device for balance Standing balance-Leahy Scale: Fair                               Pertinent Vitals/Pain Pain Assessment Pain Assessment: Faces Faces Pain Scale: Hurts a little bit Pain Location: L LE Pain Descriptors / Indicators: Discomfort Pain Intervention(s): Monitored during session, Repositioned, Premedicated before session    Home Living Family/patient expects to be discharged to:: Private residence Living Arrangements: Non-relatives/Friends Available Help at Discharge: Friend(s);Available PRN/intermittently Type of Home: House Home Access: Stairs to enter Entrance Stairs-Rails: Right Entrance Stairs-Number of Steps: 3-4   Home Layout: One level Home Equipment: None      Prior  Function Prior Level of Function : Independent/Modified Independent;Working/employed;Driving             Mobility Comments: works part time at [employer] and full time  at [employer] improvement ADLs Comments: independent     Higher education careers adviser        Extremity/Trunk Assessment   Upper Extremity Assessment Upper Extremity Assessment: Defer to OT evaluation    Lower Extremity Assessment Lower Extremity Assessment: LLE deficits/detail LLE Coordination: decreased gross motor    Cervical / Trunk Assessment Cervical / Trunk Assessment: Normal  Communication   Communication: No difficulties  Cognition Arousal/Alertness: Awake/alert Behavior During Therapy: WFL for tasks assessed/performed Overall Cognitive Status: Within Functional Limits for tasks assessed                                          General Comments      Exercises     Assessment/Plan    PT Assessment Patient needs continued PT services  PT Problem List Decreased strength;Decreased activity tolerance;Decreased balance;Decreased mobility;Decreased skin integrity;Decreased knowledge of use of DME;Pain       PT Treatment Interventions DME instruction;Gait training;Therapeutic exercise;Balance training;Stair training;Functional mobility training;Patient/family education;Therapeutic activities;Modalities    PT Goals (Current goals can be found in the Care Plan section)  Acute Rehab PT Goals Patient Stated Goal: to improve, return to work PT Goal Formulation: With patient Time For Goal Achievement: 06/04/22 Potential to Achieve Goals: Good    Frequency BID     Co-evaluation               AM-PAC PT "6 Clicks" Mobility  Outcome Measure Help needed turning from your back to your side while in a flat bed without using bedrails?: None Help needed moving from lying on your back to sitting on the side of a flat bed without using bedrails?: None Help needed moving to and from a bed to a chair (including a wheelchair)?: A Little Help needed standing up from a chair using your arms (e.g., wheelchair or bedside chair)?: A Little Help needed to walk in hospital  room?: A Little Help needed climbing 3-5 steps with a railing? : A Lot 6 Click Score: 19    End of Session Equipment Utilized During Treatment: Gait belt Activity Tolerance: Patient tolerated treatment well Patient left: in chair;with call bell/phone within reach;with chair alarm set Nurse Communication: Mobility status PT Visit Diagnosis: Other abnormalities of gait and mobility (R26.89);Unsteadiness on feet (R26.81);Difficulty in walking, not elsewhere classified (R26.2);Pain;History of falling (Z91.81) Pain - Right/Left: Left Pain - part of body: Ankle and joints of foot    Time: 0911-0929 PT Time Calculation (min) (ACUTE ONLY): 18 min   Charges:   PT Evaluation $PT Eval Moderate Complexity: 1 Mod          Ewelina Naves, PT, GCS 05/23/22,9:42 AM

## 2022-05-23 NOTE — TOC Initial Note (Addendum)
Transition of Care [facility]) - Initial/Assessment Note    Patient Details  Name: Vickie Hamilton MRN: [id number] Date of Birth: [birth year]  [facility] CM/SW Contact:    Liliana Cline, LCSW Phone Number: 05/23/2022, 3:55 PM  Clinical Narrative:                 Spoke to patient regarding PT/OT recs for HH, 3in1, RW.  Patient lives with a roommate. Drives herself to appts. PCP is [facility] [place].  Patient is agreeable to RW and 3in1 being ordered at DC, but would like to know if there are copays.  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  Patient is agreeable to [facility]. Confirmed home address. Reached out to [place] with Vicenta Aly with Tomasita Morrow with [facility] to see if they can accept referral. Awaiting responses. Patient is aware she may need to consider OPPT if HH cannot be arranged.   5:05- [facility] declined. [facility] declined. Waiting to hear from [facility].    Expected Discharge Plan: Home w Home Health Services Barriers to Discharge: Continued Medical Work up   Patient Goals and CMS Choice Patient states their goals for this hospitalization and ongoing recovery are:: home with health CMS Medicare.gov Compare Post Acute Care list provided to:: Patient Choice offered to / list presented to : Patient      Expected Discharge Plan and Services       Living arrangements for the past 2 months: Single Family Home                                      Prior Living Arrangements/Services Living arrangements for the past 2 months: Single Family Home Lives with:: Roommate Patient language and need for interpreter reviewed:: Yes Do you feel safe going back to the place where you live?: Yes      Need for Family Participation in Patient Care: Yes (Comment) Care giver support system in place?: Yes (comment)   Criminal Activity/Legal Involvement Pertinent to Current Situation/Hospitalization: No - Comment as  needed  Activities of Daily Living Home Assistive Devices/Equipment: Eyeglasses, Dentures (specify type) ADL Screening (condition at time of admission) Patient's cognitive ability adequate to safely complete daily activities?: Yes Is the patient deaf or have difficulty hearing?: No Does the patient have difficulty seeing, even when wearing glasses/contacts?: No Does the patient have difficulty concentrating, remembering, or making decisions?: No Patient able to express need for assistance with ADLs?: Yes Does the patient have difficulty dressing or bathing?: No Independently performs ADLs?: No Communication: Independent Dressing (OT): Needs assistance Is this a change from baseline?: Change from baseline, expected to last <3days Grooming: Independent Feeding: Independent Bathing: Needs assistance Is this a change from baseline?: Change from baseline, expected to last <3 days Toileting: Needs assistance Is this a change from baseline?: Change from baseline, expected to last <3 days In/Out Bed: Needs assistance Is this a change from baseline?: Change from baseline, expected to last <3 days Walks in Home: Needs assistance Is this a change from baseline?: Change from baseline, expected to last <3 days Does the patient have difficulty walking or climbing stairs?: Yes Weakness of Legs: Left Weakness of Arms/Hands: None  Permission Sought/Granted Permission sought to share information with : Facility Industrial/product designer granted to share information with : Yes, Verbal Permission Granted     Permission granted  to share info w AGENCY: HH and DME agencies        Emotional Assessment       Orientation: : Oriented to Situation, Oriented to  Time, Oriented to Place, Oriented to Self Alcohol / Substance Use: Not Applicable Psych Involvement: No (comment)  Admission diagnosis:  Closed left ankle fracture [Z61.096E] Patient Active Problem List   Diagnosis Date Noted    Closed left ankle fracture 05/22/2022   Tobacco abuse 05/22/2022   Fall at home, initial encounter 05/22/2022   GERD (gastroesophageal reflux disease) 05/22/2022   Obesity (BMI 30-39.9) 05/22/2022   HTN (hypertension) 05/22/2022   Asthma 05/22/2022   Leukocytosis 05/22/2022   PCP:  [facility] [place] Primary Pharmacy:   [facility] [address] [place] - [address] [place] [address] Phone: [phone] Fax: [phone]  [id number] - [place], [place] - [address] [place] [address] Phone: [phone] Fax: [phone]     Social Determinants of Health (SDOH) Social History: SDOH Screenings   Food Insecurity: No Food Insecurity (05/22/2022)  Housing: Low Risk  (05/22/2022)  Transportation Needs: No Transportation Needs (05/22/2022)  Utilities: Not At Risk (05/22/2022)  Tobacco Use: Medium Risk (05/23/2022)   SDOH Interventions:     Readmission Risk Interventions     No data to display

## 2022-05-23 NOTE — Assessment & Plan Note (Signed)
--  Protonix, Carafate

## 2022-05-23 NOTE — Plan of Care (Signed)

## 2022-05-23 NOTE — Evaluation (Signed)
Occupational Therapy Evaluation Patient Details Name: Vickie Hamilton MRN: [id number] DOB: 28-Aug-1965 Today's Date: 05/23/2022   History of Present Illness Vickie Hamilton is a 57 y.o. female with medical history significant of asthma, GERD, obesity, former smoker, who presents with fall and left ankle pain. Trimalleolar fracture s/p ORIF 05/22/22. NWB L LE.   Clinical Impression   Pt received seated in recliner; stating she needs to use the bathroom and wanting to walk into bathroom to try to have bowel movement; OT placed BSC over toilet. Pt stating that her LLE is hurting more since she's been up in the chair; wishing to t/f back to bed at end of session. Pt supervision (likely MOD (I) in actuality) to bathroom, maintaining NWB LLE status with RW. Pt unable to have bowel movement at this time. Returned to bed MOD (I).  Discussed ADLs at home set up. Will practice next OT session.  Patient will benefit from continued OT while in acute care. Will need a BSC for safety at home (at bedside or over toilet, needing handrails for safety).     Recommendations for follow up therapy are one component of a multi-disciplinary discharge planning process, led by the attending physician.  Recommendations may be updated based on patient status, additional functional criteria and insurance authorization.   Assistance Recommended at Discharge PRN  Patient can return home with the following Assistance with cooking/housework;Assist for transportation;Help with stairs or ramp for entrance    Functional Status Assessment  Patient has had a recent decline in their functional status and demonstrates the ability to make significant improvements in function in a reasonable and predictable amount of time.  Equipment Recommendations  BSC/3in1    Recommendations for Other Services       Precautions / Restrictions Precautions Precautions: Fall Precaution Comments: cast on L foot/ankle Splint/Cast - Date  Prophylactic Dressing Applied (if applicable): 05/22/22 Restrictions Weight Bearing Restrictions: Yes LLE Weight Bearing: Non weight bearing      Mobility Bed Mobility                    Transfers Overall transfer level: Needs assistance Equipment used: Rolling walker (2 wheels) Transfers: Sit to/from Stand Sit to Stand: Supervision           General transfer comment: Safe t/f; no physical assist needed; safe use of RW to walk with NWB LLE approx 20 feet from chair to [facility] over toilet in bathroom. Likely at MOD (I) level.      Balance Overall balance assessment: Needs assistance Sitting-balance support: Feet supported Sitting balance-Leahy Scale: Normal     Standing balance support: Bilateral upper extremity supported, During functional activity, Reliant on assistive device for balance Standing balance-Leahy Scale: Fair                             ADL either performed or assessed with clinical judgement   ADL Overall ADL's : Needs assistance/impaired Eating/Feeding: Set up     Grooming Details (indicate cue type and reason): education provided on standing at sink with RW for safe grooming; did not practice today.       Lower Body Bathing Details (indicate cue type and reason): discussed seated sponge bathing       Lower Body Dressing Details (indicate cue type and reason): discussed LB dressing techniques while NWB (bed level vs standing with one UE support); discussed clothing choices for LB ease of donning. Toilet Transfer:  Modified Independent;BSC/3in1 (BSC over toilet in bathroom)         Tub/Shower Transfer Details (indicate cue type and reason): discussed with pt and she will sponge bathe at home due to cast on RLE and having tub shower. Functional mobility during ADLs: Modified independent;Rolling walker (2 wheels);Supervision/safety General ADL Comments: Pt used RW to walk NWB LLE to bathroom (approx 20 feet); strong BIL UE from  working. No loss of balance.     Vision Baseline Vision/History: 1 Wears glasses       Perception     Praxis      Pertinent Vitals/Pain Pain Assessment Pain Assessment: 0-10 Pain Score: 3  Pain Location: L LE Pain Descriptors / Indicators: Discomfort, Throbbing Pain Intervention(s): Limited activity within patient's tolerance, Monitored during session     Hand Dominance     Extremity/Trunk Assessment Upper Extremity Assessment Upper Extremity Assessment: Overall WFL for tasks assessed   Lower Extremity Assessment Lower Extremity Assessment: Defer to PT evaluation LLE Coordination: decreased gross motor   Cervical / Trunk Assessment Cervical / Trunk Assessment: Normal   Communication Communication Communication: No difficulties   Cognition Arousal/Alertness: Awake/alert Behavior During Therapy: WFL for tasks assessed/performed Overall Cognitive Status: Within Functional Limits for tasks assessed                                       General Comments  Motivated; doing well.    Exercises     Shoulder Instructions      Home Living Family/patient expects to be discharged to:: Private residence Living Arrangements: Non-relatives/Friends Available Help at Discharge: Friend(s);Available PRN/intermittently (roommate works) Type of Home: House Home Access: Stairs to enter Secretary/administrator of Steps: 3-4 Entrance Stairs-Rails: Right Home Layout: One level     Bathroom Shower/Tub: Chief Strategy Officer: Standard Bathroom Accessibility: Yes How Accessible: Accessible via walker Home Equipment: None          Prior Functioning/Environment Prior Level of Function : Independent/Modified Independent;Working/employed;Driving             Mobility Comments: works part time at [employer] and full time at [employer] improvement ADLs Comments: independent        OT Problem List: Impaired balance (sitting and/or standing)       OT Treatment/Interventions: Self-care/ADL training;DME and/or AE instruction;Therapeutic activities;Patient/family education    OT Goals(Current goals can be found in the care plan section) Acute Rehab OT Goals Patient Stated Goal: Go home; heal. OT Goal Formulation: With patient Time For Goal Achievement: 06/06/22 Potential to Achieve Goals: Good ADL Goals Pt Will Perform Grooming: with modified independence;standing Pt Will Perform Lower Body Dressing: with modified independence;sit to/from stand  OT Frequency: Min 3X/week    Co-evaluation              AM-PAC OT "6 Clicks" Daily Activity     Outcome Measure Help from another person eating meals?: None Help from another person taking care of personal grooming?: None Help from another person toileting, which includes using toliet, bedpan, or urinal?: None Help from another person bathing (including washing, rinsing, drying)?: A Little Help from another person to put on and taking off regular upper body clothing?: None Help from another person to put on and taking off regular lower body clothing?: None 6 Click Score: 23   End of Session Equipment Utilized During Treatment: Rolling walker (2 wheels);Other (comment) (BSC over toilet)  Nurse Communication: Mobility status  Activity Tolerance: Patient tolerated treatment well Patient left: in bed;with bed alarm set;with call bell/phone within reach  OT Visit Diagnosis: Unsteadiness on feet (R26.81)                Time: 4098-1191 OT Time Calculation (min): 26 min Charges:  OT General Charges $OT Visit: 1 Visit OT Evaluation $OT Eval Moderate Complexity: 1 Mod OT Treatments $Self Care/Home Management : 8-22 mins  Linward Foster, MS, OTR/L  Alvester Morin 05/23/2022, 11:52 AM

## 2022-05-23 NOTE — Assessment & Plan Note (Signed)
Mechanical in nature, resulting in ankle fracture. --PT/OT --Fall precautions

## 2022-05-23 NOTE — Assessment & Plan Note (Signed)
Stable -Bronchodilators and as needed Mucinex 

## 2022-05-23 NOTE — Progress Notes (Signed)
Progress Note   Patient: Vickie Hamilton ZOX:[id number] DOB: Feb 20, 1965 DOA: 05/22/2022     1 DOS: the patient was seen and examined on 05/23/2022   Brief hospital course: HPI on admission 05/22/2022 by Dr. Clyde Lundborg: "Vickie Hamilton is a 57 y.o. female with medical history significant of asthma, GERD, obesity, former smoker, who presents with fall and left ankle pain.   Patient states that she accidentally fell when she was walking down the stair, tripped over steps at about 730 this morning.  No loss of consciousness.  Strongly denies any head or neck injury.  No headache or neck pain.  She does not want to do CT scan of head and neck.  She developed pain in left ankle which has obvious deformity.  The pain is constant, sharp, severe, nonradiating, aggravated by movement.  ..."  ED evaluation revealed patient had left trimalleolar ankle fracture.  She was admitted to the hospital with Orthopedic surgery consulted.  Pt underwent ORIF on day of admission.  Assessment and Plan: * Closed left ankle fracture Status post ORIF on 05/22/22 with Dr. Martha Clan --Orthopedic surgery following --Continue ice and elevation of LLE --Non-weight-bearing on LLE x 6-8 weeks --Lovenox for DVT prophylaxis (ASA 325 mg BID at discharge) --Follow up in Ortho clinic 10-14 days for splint removal, staple removal, xray & casting --Pain control with scheduled Tylenol, PRN oxycodone, IV for breakthrough pain only --PT/OT evaluations --Robaxin PRN for muscle spasms   Fall at home, initial encounter Mechanical in nature, resulting in ankle fracture. --PT/OT --Fall precautions  GERD (gastroesophageal reflux disease) --Protonix, Carafate  HTN (hypertension) --Continue lisinopril --PRN hydralazine  Asthma Stable --Bronchodilators and as needed Mucinex  Leukocytosis Likely reactive.  No fevers or s/sx's of infection. --Monitor CBC  Obesity (BMI 30-39.9) Body mass index is 30.8 kg/m. Complicates overall  care and prognosis.  Recommend lifestyle modifications including physical activity and diet for weight loss and overall long-term health.        Subjective: Pt seen up in recliner this AM.  Reports significant ankle pain but medication helps somewhat.  No other acute complaints.  States she lives at home alone but does have a roommate.  Physical Exam: Vitals:   05/22/22 2337 05/23/22 0427 05/23/22 0841 05/23/22 0854  BP: (!) 144/73 (!) 156/82 (!) 147/78   Pulse: 72 72 64   Resp: 18 19 18    Temp: 99.2 F (37.3 C) (!) 97.4 F (36.3 C) 98.6 F (37 C)   TempSrc:   Oral   SpO2: 98% 100% 100%   Weight:    81.4 kg  Height:       General exam: awake, appears drowsy, no acute distress HEENT: moist mucus membranes, hearing grossly normal  Respiratory system: CTAB, no wheezes, rales or rhonchi, normal respiratory effort. Cardiovascular system: normal S1/S2, RRR.   Gastrointestinal system: soft, NT, ND Central nervous system: A&O x 3. no gross focal neurologic deficits, normal speech Extremities: left ankle splinted with clean dry intact ace wrap in place, left toes have intact sensation and normal temperature and color Skin: dry, intact, normal temperature Psychiatry: normal mood, congruent affect, judgement and insight appear normal   Data Reviewed:  Notable labs ---  WBC 17.0 , glucose 130, Ca 8.8  Family Communication: None present. Pt able to update.  Disposition: Status is: Inpatient Remains inpatient appropriate because: Pain uncontrolled and needs further evaluation with PT to assess safety to return home   Planned Discharge Destination: Home with Home Health  Time spent: 42 minutes  Author: Pennie Banter, DO 05/23/2022 1:31 PM  For on call review [email or web address].

## 2022-05-23 NOTE — Progress Notes (Signed)
Patient is not able to walk the distance required to go the bathroom, or he/she is unable to safely negotiate stairs required to access the bathroom.  A 3in1 BSC will alleviate this problem  

## 2022-05-23 NOTE — Progress Notes (Signed)
Subjective:  POD #1 s/p ORIF of left trimalleolar ankle fracture.   Patient reports left ankle pain as moderate.  Patient was able to participate with physical and occupational therapy today.  Objective:   VITALS:   Vitals:   05/22/22 2337 05/23/22 0427 05/23/22 0841 05/23/22 0854  BP: (!) 144/73 (!) 156/82 (!) 147/78   Pulse: 72 72 64   Resp: 18 19 18    Temp: 99.2 F (37.3 C) (!) 97.4 F (36.3 C) 98.6 F (37 C)   TempSrc:   Oral   SpO2: 98% 100% 100%   Weight:    81.4 kg  Height:        PHYSICAL EXAM: Left lower extremity: Splint is in place.  Dressings are clean and dry.  Patient can flex and extend her toes and has intact sensation light touch in all 5 toes.  Her toes are well-perfused.   LABS  Results for orders placed or performed during the hospital encounter of 05/22/22 (from the past 24 hour(s))  Surgical PCR screen     Status: None   Collection Time: 05/22/22  6:00 PM   Specimen: Nasal Mucosa; Nasal Swab  Result Value Ref Range   MRSA, PCR NEGATIVE NEGATIVE   Staphylococcus aureus NEGATIVE NEGATIVE  CBC     Status: Abnormal   Collection Time: 05/23/22  4:30 AM  Result Value Ref Range   WBC 17.0 (H) 4.0 - 10.5 K/uL   RBC 4.46 3.87 - 5.11 MIL/uL   Hemoglobin 13.3 12.0 - 15.0 g/dL   HCT 96.2 95.2 - 84.1 %   MCV 89.0 80.0 - 100.0 fL   MCH 29.8 26.0 - 34.0 pg   MCHC 33.5 30.0 - 36.0 g/dL   RDW 32.4 40.1 - 02.7 %   Platelets 379 150 - 400 K/uL   nRBC 0.0 0.0 - 0.2 %  Basic metabolic panel     Status: Abnormal   Collection Time: 05/23/22  4:30 AM  Result Value Ref Range   Sodium 138 135 - 145 mmol/L   Potassium 3.6 3.5 - 5.1 mmol/L   Chloride 100 98 - 111 mmol/L   CO2 27 22 - 32 mmol/L   Glucose, Bld 130 (H) 70 - 99 mg/dL   BUN 20 6 - 20 mg/dL   Creatinine, Ser 2.53 0.44 - 1.00 mg/dL   Calcium 8.8 (L) 8.9 - 10.3 mg/dL   GFR, Estimated >66 >44 mL/min   Anion gap 11 5 - 15  Glucose, capillary     Status: Abnormal   Collection Time: 05/23/22  8:27 AM   Result Value Ref Range   Glucose-Capillary 107 (H) 70 - 99 mg/dL    DG Ankle Left Port  Result Date: 05/22/2022 CLINICAL DATA:  Trimalleolar fracture, status post fixation EXAM: PORTABLE LEFT ANKLE - 2 VIEW COMPARISON:  Earlier today most recent 10:29 a.m. FINDINGS: 5:05 p.m. Plaster splint obscures bony detail. Plate and screw fixation of the distal fibula with anatomic alignment. Leg screw fixation of the medial malleolus. Ankle mortise is intact. Diffuse soft tissue swelling. IMPRESSION: Interval internal fixation with anatomic alignment. Electronically Signed   By: Jeronimo Greaves M.D.   On: 05/22/2022 17:27   DG MINI C-ARM IMAGE ONLY  Result Date: 05/22/2022 There is no interpretation for this exam.  This order is for images obtained during a surgical procedure.  Please See "Surgeries" Tab for more information regarding the procedure.   DG Ankle Complete Left  Result Date: 05/22/2022 CLINICAL DATA:  post reduction  EXAM: LEFT ANKLE COMPLETE - 3+ VIEW COMPARISON:  Earlier films of the same day FINDINGS: Comminuted distal fibular shaft fracture fragments with reduction of previous angulation deformity. Significant reduction of the comminuted medial malleolar fracture deformity. The tibiotalar alignment appears grossly normal. Minimally displaced posterior malleolar fracture. Medial soft tissue swelling. Calcaneal spurs. IMPRESSION: 1. Reduction of comminuted distal fibular and medial malleolar fractures. 2. Minimally displaced posterior malleolar fracture. Electronically Signed   By: Corlis Leak M.D.   On: 05/22/2022 10:37   DG Ankle Complete Left  Result Date: 05/22/2022 CLINICAL DATA:  Fall. Ankle pain and deformity. Fell down steps this morning. EXAM: LEFT FOOT - COMPLETE 3+ VIEW; LEFT ANKLE COMPLETE - 3+ VIEW COMPARISON:  None. FINDINGS: Fracture dislocation of the distal tibia and fibula is present with 40 degrees angulation of a comminuted fibular fracture. Avulsion of the medial malleolus is  present. Fibular fracture is comminuted. Talus is displaced laterally relative to the distal tibia. No additional fractures are present foot. Mild degenerative changes are present midfoot. Prominent calcaneal spur is noted. IMPRESSION: 1. Fracture dislocation of the distal tibia and fibula with 40 degrees angulation of a comminuted fibular fracture. 2. Avulsion of the medial malleolus. Electronically Signed   By: Marin Roberts M.D.   On: 05/22/2022 08:34   DG Foot Complete Left  Result Date: 05/22/2022 CLINICAL DATA:  Fall. Ankle pain and deformity. Fell down steps this morning. EXAM: LEFT FOOT - COMPLETE 3+ VIEW; LEFT ANKLE COMPLETE - 3+ VIEW COMPARISON:  None. FINDINGS: Fracture dislocation of the distal tibia and fibula is present with 40 degrees angulation of a comminuted fibular fracture. Avulsion of the medial malleolus is present. Fibular fracture is comminuted. Talus is displaced laterally relative to the distal tibia. No additional fractures are present foot. Mild degenerative changes are present midfoot. Prominent calcaneal spur is noted. IMPRESSION: 1. Fracture dislocation of the distal tibia and fibula with 40 degrees angulation of a comminuted fibular fracture. 2. Avulsion of the medial malleolus. Electronically Signed   By: Marin Roberts M.D.   On: 05/22/2022 08:34    Assessment/Plan: 1 Day Post-Op   Principal Problem:   Closed left ankle fracture Active Problems:   Fall at home, initial encounter   GERD (gastroesophageal reflux disease)   Obesity (BMI 30-39.9)   HTN (hypertension)   Asthma   Leukocytosis  Continue ice and elevation of the left lower extremity.  Continue physical and Occupational Therapy.  Patient will need another day for pain control and continued therapy.  Patient will be nonweightbearing on the left lower extremity for 6 to 8 weeks postop.  Patient will follow-up in the orthopedic office in 10 to 14 days for splint removal, staple removal, x-ray and  casting.  Patient should be discharged on enteric-coated aspirin 325 mg twice daily versus Lovenox 40 mg daily until follow-up.    Juanell Fairly , MD 05/23/2022, 12:41 PM

## 2022-05-23 NOTE — Assessment & Plan Note (Signed)
Likely reactive.  No fevers or s/sx's of infection. --Monitor CBC

## 2022-05-23 NOTE — Progress Notes (Signed)
Initial Nutrition Assessment RD working remotely.   DOCUMENTATION CODES:   Not applicable  INTERVENTION:  - ordered Ensure Plus High Protein once/day, each supplement provides 350 kcal and 20 grams of protein.  - ordered 30 ml Prosource Plus once/day, each supplement provides 100 kcal and 15 grams protein.   - ordered 1 tablet multivitamin with minerals/day.  - weigh patient today.   NUTRITION DIAGNOSIS:   Increased nutrient needs related to post-op healing as evidenced by estimated needs.  GOAL:   Patient will meet greater than or equal to 90% of their needs  MONITOR:   PO intake, Supplement acceptance, Labs, Weight trends  REASON FOR ASSESSMENT:   Consult Hip fracture protocol  ASSESSMENT:   57 y.o. female with medical history of asthma, GERD, obesity, former smoker. She presented to the ED after a fall and subsequent L ankle pain. Noted deformity to L ankle in the ED.  Diet advanced from NPO to Regular, thin liquids yesterday at 1802. No meal completion percentages documented in the flow sheet.   Patient is POD #1 ORIF L trimalleolar ankle fracture. She has not been assessed by a [place] RD at any time in the past.   Weight yesterday was documented as 180 lb and appears to have been copied forward from 07/29/20. Edema to LLE documented in the flowsheet; severity not indicated.    Labs reviewed; CBG: 107 mg/dl.  Medications reviewed; 100 mg colace BID, 40 mg protonix/day, 1 tablet senokot BID.  IVF; NS @ 75 ml/hr.    NUTRITION - FOCUSED PHYSICAL EXAM:  RD working remotely.  Diet Order:   Diet Order             Diet regular Room service appropriate? Yes; Fluid consistency: Thin  Diet effective now                   EDUCATION NEEDS:   No education needs have been identified at this time  Skin:  Skin Assessment: Skin Integrity Issues: Skin Integrity Issues:: Incisions Incisions: 5/18 (L leg)  Last BM:  PTA/unknown  Height:   Ht  Readings from Last 1 Encounters:  05/22/22 5\' 4"  (1.626 m)    Weight:   Wt Readings from Last 1 Encounters:  05/22/22 81.6 kg    BMI:  Body mass index is 30.9 kg/m.  Estimated Nutritional Needs:  Kcal:  1900-2100 kcal Protein:  90-100 grams Fluid:  >/= 2 L/day    Trenton Gammon, MS, RD, LDN, CNSC Clinical Dietitian PRN/Relief staff On-call/weekend pager # available in [facility]

## 2022-05-23 NOTE — Assessment & Plan Note (Signed)
--  Continue lisinopril --PRN hydralazine

## 2022-05-23 NOTE — Assessment & Plan Note (Signed)
Body mass index is 30.8 kg/m. Complicates overall care and prognosis.  Recommend lifestyle modifications including physical activity and diet for weight loss and overall long-term health.

## 2022-05-23 NOTE — Assessment & Plan Note (Addendum)
Status post ORIF on 05/22/22 with Dr. Martha Clan --Orthopedic surgery following --Continue ice and elevation of LLE --Non-weight-bearing on LLE x 6-8 weeks --Lovenox here for DVT prophylaxis  --Dicharge on ASA 325 mg BID with meals --Follow up in Ortho clinic 10-14 days for splint removal, staple removal, xray & casting --Pain control with scheduled Tylenola and tramadol, PRN oxycodone --Bowel regimen --Zofran PRN if nausea from pain meds --PT/OT evaluations - Home health --Robaxin PRN for muscle spasms

## 2022-05-23 NOTE — Hospital Course (Signed)
HPI on admission 05/22/2022 by Dr. Clyde Lundborg: "Vickie Hamilton is a 57 y.o. female with medical history significant of asthma, GERD, obesity, former smoker, who presents with fall and left ankle pain.   Patient states that she accidentally fell when she was walking down the stair, tripped over steps at about 730 this morning.  No loss of consciousness.  Strongly denies any head or neck injury.  No headache or neck pain.  She does not want to do CT scan of head and neck.  She developed pain in left ankle which has obvious deformity.  The pain is constant, sharp, severe, nonradiating, aggravated by movement.  ..."  ED evaluation revealed patient had left trimalleolar ankle fracture.  She was admitted to the hospital with Orthopedic surgery consulted.  Pt underwent ORIF on day of admission.

## 2022-05-24 DIAGNOSIS — S82892A Other fracture of left lower leg, initial encounter for closed fracture: Secondary | ICD-10-CM | POA: Diagnosis not present

## 2022-05-24 LAB — CBC
HCT: 37.7 % (ref 36.0–46.0)
Hemoglobin: 12.2 g/dL (ref 12.0–15.0)
MCH: 29.2 pg (ref 26.0–34.0)
MCHC: 32.4 g/dL (ref 30.0–36.0)
MCV: 90.2 fL (ref 80.0–100.0)
Platelets: 370 10*3/uL (ref 150–400)
RBC: 4.18 MIL/uL (ref 3.87–5.11)
RDW: 13.6 % (ref 11.5–15.5)
WBC: 10.5 10*3/uL (ref 4.0–10.5)
nRBC: 0 % (ref 0.0–0.2)

## 2022-05-24 LAB — GLUCOSE, CAPILLARY: Glucose-Capillary: 110 mg/dL — ABNORMAL HIGH (ref 70–99)

## 2022-05-24 MED ORDER — OXYCODONE HCL 5 MG PO TABS: 5.0000 mg | ORAL_TABLET | ORAL | 0 refills | Status: AC | PRN

## 2022-05-24 MED ORDER — TRAMADOL HCL 50 MG PO TABS: 50.0000 mg | ORAL_TABLET | Freq: Four times a day (QID) | ORAL | 0 refills | Status: AC

## 2022-05-24 MED ORDER — ASPIRIN 325 MG PO TBEC: 325.0000 mg | DELAYED_RELEASE_TABLET | Freq: Two times a day (BID) | ORAL | 0 refills | Status: AC

## 2022-05-24 MED ORDER — SENNA 8.6 MG PO TABS: 1.0000 | ORAL_TABLET | Freq: Two times a day (BID) | ORAL | 0 refills | Status: AC

## 2022-05-24 MED ORDER — ACETAMINOPHEN 500 MG PO TABS: 1000.0000 mg | ORAL_TABLET | Freq: Three times a day (TID) | ORAL | 0 refills | Status: AC

## 2022-05-24 MED ORDER — ADULT MULTIVITAMIN W/MINERALS CH: 1.0000 | ORAL_TABLET | Freq: Every day | ORAL | Status: AC

## 2022-05-24 MED ORDER — ENSURE ENLIVE PO LIQD: 237.0000 mL | ORAL | 12 refills | Status: AC

## 2022-05-24 MED ORDER — DOCUSATE SODIUM 100 MG PO CAPS: 100.0000 mg | ORAL_CAPSULE | Freq: Two times a day (BID) | ORAL | 0 refills | Status: AC

## 2022-05-24 MED ORDER — METHOCARBAMOL 500 MG PO TABS: 500.0000 mg | ORAL_TABLET | Freq: Four times a day (QID) | ORAL | 0 refills | Status: AC | PRN

## 2022-05-24 NOTE — Progress Notes (Signed)
  Subjective:  POD #2 s/p ORIF left ankle.   Patient reports left ankle pain as mild to moderate.  Patient sitting on the side of the bed eating lunch.  Patient states pain is slowly improving.  Objective:   VITALS:   Vitals:   05/23/22 1950 05/23/22 2324 05/24/22 0427 05/24/22 0814  BP: 107/70 (!) 95/41 (!) 104/54 124/65  Pulse: 69 75 75 72  Resp: 16 14 14 18   Temp: 97.6 F (36.4 C) 97.8 F (36.6 C) 97.8 F (36.6 C) 98.3 F (36.8 C)  TempSrc:      SpO2: 98% 99% 95% 100%  Weight:      Height:        PHYSICAL EXAM: Left lower extremity: Splint and dressing remain clean dry and intact.  Patient remains neurovascular intact.  She can flex and extend her toes.  Her toes are well-perfused.  She has intact sensation light touch in all 5 toes.   LABS  Results for orders placed or performed during the hospital encounter of 05/22/22 (from the past 24 hour(s))  CBC     Status: None   Collection Time: 05/24/22  4:44 AM  Result Value Ref Range   WBC 10.5 4.0 - 10.5 K/uL   RBC 4.18 3.87 - 5.11 MIL/uL   Hemoglobin 12.2 12.0 - 15.0 g/dL   HCT 24.2 35.3 - 61.4 %   MCV 90.2 80.0 - 100.0 fL   MCH 29.2 26.0 - 34.0 pg   MCHC 32.4 30.0 - 36.0 g/dL   RDW 43.1 54.0 - 08.6 %   Platelets 370 150 - 400 K/uL   nRBC 0.0 0.0 - 0.2 %  Glucose, capillary     Status: Abnormal   Collection Time: 05/24/22  7:52 AM  Result Value Ref Range   Glucose-Capillary 110 (H) 70 - 99 mg/dL    DG Ankle Left Port  Result Date: 05/22/2022 CLINICAL DATA:  Trimalleolar fracture, status post fixation EXAM: PORTABLE LEFT ANKLE - 2 VIEW COMPARISON:  Earlier today most recent 10:29 a.m. FINDINGS: 5:05 p.m. Plaster splint obscures bony detail. Plate and screw fixation of the distal fibula with anatomic alignment. Leg screw fixation of the medial malleolus. Ankle mortise is intact. Diffuse soft tissue swelling. IMPRESSION: Interval internal fixation with anatomic alignment. Electronically Signed   By: Jeronimo Greaves M.D.    On: 05/22/2022 17:27    Assessment/Plan: 2 Days Post-Op   Principal Problem:   Closed left ankle fracture Active Problems:   Fall at home, initial encounter   GERD (gastroesophageal reflux disease)   Obesity (BMI 30-39.9)   HTN (hypertension)   Asthma   Leukocytosis  Patient is progressing with PT.  She will attempt stairs this afternoon.  If she is able to navigate stairs she may be discharged home from an orthopedic standpoint once cleared medically.  Recommend patient take enteric-coated aspirin 325 mg p.o. twice daily for DVT prophylaxis till follow-up.  Will follow-up with [facility] in [place] in 2 weeks for staple removal and cast placement.  Patient must remain nonweightbearing for a total of 6 to 8 weeks postop.  She will use her walker for assistance with ambulation.    Juanell Fairly , MD 05/24/2022, 1:27 PM

## 2022-05-24 NOTE — TOC Progression Note (Addendum)
Transition of Care [facility]) - Progression Note    Patient Details  Name: Vickie Hamilton MRN: [id number] Date of Birth: Jul 05, 1965  Transition of Care [facility] CM/SW Contact  Marlowe Sax, RN Phone Number: 05/24/2022, 10:55 AM  Clinical Narrative:   Reached out to Bjorn Loser at [facility] to see if they can accept her for [facility], RW and 3 in 1 requested from [employer], let them know that she wants to know if there are copays before delivery [facility] accepted the patient for PT   Expected Discharge Plan: Home w Home Health Services Barriers to Discharge: Continued Medical Work up  Expected Discharge Plan and Services       Living arrangements for the past 2 months: Single Family Home                                       Social Determinants of Health (SDOH) Interventions SDOH Screenings   Food Insecurity: No Food Insecurity (05/22/2022)  Housing: Low Risk  (05/22/2022)  Transportation Needs: No Transportation Needs (05/22/2022)  Utilities: Not At Risk (05/22/2022)  Tobacco Use: Medium Risk (05/23/2022)    Readmission Risk Interventions     No data to display

## 2022-05-24 NOTE — Plan of Care (Signed)

## 2022-05-24 NOTE — Progress Notes (Signed)
Physical Therapy Treatment Patient Details Name: Vickie Hamilton MRN: [id number] DOB: 08/04/1965 Today's Date: 05/24/2022   History of Present Illness Vickie Hamilton is a 57 y.o. female with medical history significant of asthma, GERD, obesity, former smoker, who presents with fall and left ankle pain. Trimalleolar fracture s/p ORIF 05/22/22. NWB L LE.    PT Comments    Pt done with lunch, agreeable to BID session, DTR arriving soon to help with stairs training. Pt assisted to BR for voiding, then back to bed. Author sets up recliner for transport off unit. Pt/DTR given demonstration of 2 methods of stairs navigation, we decide backwards with RW is most easily applicable. Educated on RW adjustment of 2 pinhole offset. Pt very anxious about hurting her surgical foot. Is successful with hop, but quite short of breath and anxious. Pt ultimately too exerted and anxious to perform steps needed for safe entry. Discussed potential for additional overnight for more stairs training next date, RN/MD aware.    Recommendations for follow up therapy are one component of a multi-disciplinary discharge planning process, led by the attending physician.  Recommendations may be updated based on patient status, additional functional criteria and insurance authorization.  Follow Up Recommendations       Assistance Recommended at Discharge Intermittent Supervision/Assistance  Patient can return home with the following A little help with walking and/or transfers;A little help with bathing/dressing/bathroom;Assist for transportation;Help with stairs or ramp for entrance;Assistance with cooking/housework   Equipment Recommendations  Rolling walker (2 wheels);BSC/3in1    Recommendations for Other Services       Precautions / Restrictions Precautions Precautions: Fall Precaution Comments: splint on L foot/ankle Splint/Cast - Date Prophylactic Dressing Applied (if applicable): 05/22/22 Restrictions Weight  Bearing Restrictions: Yes LLE Weight Bearing: Non weight bearing     Mobility  Bed Mobility Overal bed mobility: Modified Independent                  Transfers Overall transfer level: Needs assistance Equipment used: Rolling walker (2 wheels) Transfers: Sit to/from Stand Sit to Stand: Supervision     Squat pivot transfers: Supervision     General transfer comment: can perform from standard height surface although difficult    Ambulation/Gait Ambulation/Gait assistance: Min guard Gait Distance (Feet): 18 Feet (bed to BR, then31ft BR back to bed.) Assistive device: Rolling walker (2 wheels) Gait Pattern/deviations: Antalgic       General Gait Details: maintains NWB, but is still easily made nauseous.Reports to have eaten some spaghetti, but not much is missing from plate.   Stairs Stairs: Yes   Stair Management: No rails, Backwards, With walker Number of Stairs: 2 (1 up, 1 down, 1 up, 1 down.) General stair comments: very anxious about failure, protecting surgical foot; demonstrates fairly good eccentric control overall, but is very SOB during effort, correlate with anxiety.   Wheelchair Mobility    Modified Rankin (Stroke Patients Only)       Balance                                            Cognition Arousal/Alertness: Awake/alert Behavior During Therapy: WFL for tasks assessed/performed, Anxious Overall Cognitive Status: Within Functional Limits for tasks assessed  Exercises      General Comments        Pertinent Vitals/Pain Pain Assessment Pain Assessment: Faces Pain Score: 4  Faces Pain Scale: Hurts even more Pain Location: L LE Pain Descriptors / Indicators: Aching Pain Intervention(s): Limited activity within patient's tolerance, Monitored during session, Repositioned    Home Living                          Prior Function            PT  Goals (current goals can now be found in the care plan section) Acute Rehab PT Goals Patient Stated Goal: to improve, return to work PT Goal Formulation: With patient Time For Goal Achievement: 06/04/22 Potential to Achieve Goals: Good Progress towards PT goals: Progressing toward goals    Frequency    BID      PT Plan Current plan remains appropriate    Co-evaluation              AM-PAC PT "6 Clicks" Mobility   Outcome Measure  Help needed turning from your back to your side while in a flat bed without using bedrails?: None Help needed moving from lying on your back to sitting on the side of a flat bed without using bedrails?: None Help needed moving to and from a bed to a chair (including a wheelchair)?: A Little Help needed standing up from a chair using your arms (e.g., wheelchair or bedside chair)?: A Little Help needed to walk in hospital room?: A Little Help needed climbing 3-5 steps with a railing? : A Lot 6 Click Score: 19    End of Session Equipment Utilized During Treatment: Gait belt Activity Tolerance: Treatment limited secondary to medical complications (Comment) Patient left: with call bell/phone within reach;in chair;with family/visitor present Nurse Communication: Mobility status PT Visit Diagnosis: Other abnormalities of gait and mobility (R26.89);Unsteadiness on feet (R26.81);Difficulty in walking, not elsewhere classified (R26.2);Pain;History of falling (Z91.81) Pain - Right/Left: Left Pain - part of body: Ankle and joints of foot     Time: 1352-1430 PT Time Calculation (min) (ACUTE ONLY): 38 min  Charges:  $Gait Training: 23-37 mins $Therapeutic Activity: 8-22 mins                    2:41 PM, 05/24/22 Rosamaria Lints, PT, DPT Physical Therapist - [facility]  [phone] (ASCOM)    Vickie Hamilton 05/24/2022, 2:38 PM

## 2022-05-24 NOTE — Progress Notes (Signed)
Occupational Therapy Treatment Patient Details Name: Vickie Hamilton MRN: [id number] DOB: Apr 17, 1965 Today's Date: 05/24/2022   History of present illness Vickie Hamilton is a 57 y.o. female with medical history significant of asthma, GERD, obesity, former smoker, who presents with fall and left ankle pain. Trimalleolar fracture s/p ORIF 05/22/22. NWB L LE.   OT comments  Pt seen for OT tx this date. Pt completed bed mobility with MOd indep, STS with RW and supv and ambulated to the bathroom maintaining her NWBing precautions throughout. Mod indep with toilet transfer (BSC over toilet) and pericare. Pt ambulating back to bed when endorsed mild nausea that improved with return to supine. Pt educated in AE/DME for LB ADL tasks, falls prevention. Pt verbalized understanding. Progressing well towards goals. Continues to benefit from skilled OT while hospitalized.    Recommendations for follow up therapy are one component of a multi-disciplinary discharge planning process, led by the attending physician.  Recommendations may be updated based on patient status, additional functional criteria and insurance authorization.    Assistance Recommended at Discharge PRN  Patient can return home with the following  Assistance with cooking/housework;Assist for transportation;Help with stairs or ramp for entrance   Equipment Recommendations  BSC/3in1    Recommendations for Other Services      Precautions / Restrictions Precautions Precautions: Fall Precaution Comments: splint on L foot/ankle Splint/Cast - Date Prophylactic Dressing Applied (if applicable): 05/22/22 Restrictions Weight Bearing Restrictions: Yes LLE Weight Bearing: Non weight bearing       Mobility Bed Mobility Overal bed mobility: Modified Independent                  Transfers Overall transfer level: Needs assistance Equipment used: Rolling walker (2 wheels) Transfers: Sit to/from Stand Sit to Stand: Supervision                  Balance   Sitting-balance support: Feet supported Sitting balance-Leahy Scale: Normal     Standing balance support: Bilateral upper extremity supported, During functional activity, Reliant on assistive device for balance Standing balance-Leahy Scale: Fair                             ADL either performed or assessed with clinical judgement   ADL Overall ADL's : Needs assistance/impaired                     Lower Body Dressing: Supervision/safety Lower Body Dressing Details (indicate cue type and reason): STS with AE Toilet Transfer: Modified Independent;BSC/3in1 Toilet Transfer Details (indicate cue type and reason): BSC over toilet Toileting- Clothing Manipulation and Hygiene: Modified independent              Extremity/Trunk Assessment              Vision       Perception     Praxis      Cognition Arousal/Alertness: Awake/alert Behavior During Therapy: WFL for tasks assessed/performed Overall Cognitive Status: Within Functional Limits for tasks assessed                                          Exercises Other Exercises Other Exercises: Pt educated in AE/DME for LB ADL tasks, falls prevention    Shoulder Instructions       General Comments      Pertinent  Vitals/ Pain       Pain Assessment Pain Assessment: 0-10 Pain Score: 6  Pain Location: L LE Pain Descriptors / Indicators: Aching Pain Intervention(s): Limited activity within patient's tolerance, Monitored during session  Home Living                                          Prior Functioning/Environment              Frequency  Min 3X/week        Progress Toward Goals  OT Goals(current goals can now be found in the care plan section)  Progress towards OT goals: Progressing toward goals  Acute Rehab OT Goals Patient Stated Goal: go home, heal OT Goal Formulation: With patient Time For Goal Achievement:  06/06/22 Potential to Achieve Goals: Good  Plan Discharge plan remains appropriate;Frequency remains appropriate    Co-evaluation                 AM-PAC OT "6 Clicks" Daily Activity     Outcome Measure   Help from another person eating meals?: None Help from another person taking care of personal grooming?: None Help from another person toileting, which includes using toliet, bedpan, or urinal?: None Help from another person bathing (including washing, rinsing, drying)?: A Little Help from another person to put on and taking off regular upper body clothing?: None Help from another person to put on and taking off regular lower body clothing?: A Little 6 Click Score: 22    End of Session Equipment Utilized During Treatment: Rolling walker (2 wheels)  OT Visit Diagnosis: Unsteadiness on feet (R26.81)   Activity Tolerance Patient tolerated treatment well   Patient Left in bed;with call bell/phone within reach   Nurse Communication          Time: 1610-9604 OT Time Calculation (min): 24 min  Charges: OT General Charges $OT Visit: 1 Visit OT Treatments $Self Care/Home Management : 23-37 mins  Arman Filter., MPH, MS, OTR/L ascom [phone] 05/24/22, 12:05 PM

## 2022-05-24 NOTE — Progress Notes (Signed)
Physical Therapy Treatment Patient Details Name: Vickie Hamilton MRN: [id number] DOB: Feb 01, 1965 Today's Date: 05/24/2022   History of Present Illness Vickie Hamilton is a 57 y.o. female with medical history significant of asthma, GERD, obesity, former smoker, who presents with fall and left ankle pain. Trimalleolar fracture s/p ORIF 05/22/22. NWB L LE.    PT Comments    Pt in bed, awake, agreeable to session. Pt able to AMB to doorway and back twice, both episodes trigger nausea, which improves after return to sitting and ~2-3 minutes recovery. Pt admits to eating light this AM due to excessive pepper on breakfast, has already had pain meds. Orthostatic vitals negative. Did not want to attempt stairs training without family member, so we'll plan on this after 1:00pm as planned with DTR.     Recommendations for follow up therapy are one component of a multi-disciplinary discharge planning process, led by the attending physician.  Recommendations may be updated based on patient status, additional functional criteria and insurance authorization.  Follow Up Recommendations       Assistance Recommended at Discharge Intermittent Supervision/Assistance  Patient can return home with the following A little help with walking and/or transfers;A little help with bathing/dressing/bathroom;Assist for transportation;Help with stairs or ramp for entrance;Assistance with cooking/housework   Equipment Recommendations  Rolling walker (2 wheels);BSC/3in1    Recommendations for Other Services       Precautions / Restrictions Precautions Precautions: Fall Precaution Comments: splint on L foot/ankle Splint/Cast - Date Prophylactic Dressing Applied (if applicable): 05/22/22 Restrictions Weight Bearing Restrictions: Yes LLE Weight Bearing: Non weight bearing     Mobility  Bed Mobility Overal bed mobility: Modified Independent                  Transfers Overall transfer level: Needs  assistance Equipment used: Rolling walker (2 wheels) Transfers: Sit to/from Stand Sit to Stand: Supervision           General transfer comment: can perform from standard height surface although difficult    Ambulation/Gait   Gait Distance (Feet): 30 Feet (twice) Assistive device: Rolling walker (2 wheels) Gait Pattern/deviations: Antalgic       General Gait Details: maintains NWB, but is easily made nauseous. No hot sweats, no dizziness. I suspect analgesia on a mostly empty stomach. Discussed with pt/RN   Stairs             Wheelchair Mobility    Modified Rankin (Stroke Patients Only)       Balance                                            Cognition Arousal/Alertness: Awake/alert Behavior During Therapy: WFL for tasks assessed/performed Overall Cognitive Status: Within Functional Limits for tasks assessed                                          Exercises      General Comments        Pertinent Vitals/Pain Pain Assessment Pain Assessment: 0-10 Pain Score: 4  Pain Location: L LE    Home Living                          Prior Function  PT Goals (current goals can now be found in the care plan section) Acute Rehab PT Goals Patient Stated Goal: to improve, return to work PT Goal Formulation: With patient Time For Goal Achievement: 06/04/22 Potential to Achieve Goals: Good Progress towards PT goals: Progressing toward goals    Frequency    BID      PT Plan Current plan remains appropriate    Co-evaluation              AM-PAC PT "6 Clicks" Mobility   Outcome Measure  Help needed turning from your back to your side while in a flat bed without using bedrails?: None Help needed moving from lying on your back to sitting on the side of a flat bed without using bedrails?: None Help needed moving to and from a bed to a chair (including a wheelchair)?: A Little Help needed  standing up from a chair using your arms (e.g., wheelchair or bedside chair)?: A Little Help needed to walk in hospital room?: A Little Help needed climbing 3-5 steps with a railing? : A Lot 6 Click Score: 19    End of Session Equipment Utilized During Treatment: Gait belt Activity Tolerance: Treatment limited secondary to medical complications (Comment) (queasy with each attempt) Patient left: in bed;with call bell/phone within reach (saya chair is not comfortable) Nurse Communication: Mobility status PT Visit Diagnosis: Other abnormalities of gait and mobility (R26.89);Unsteadiness on feet (R26.81);Difficulty in walking, not elsewhere classified (R26.2);Pain;History of falling (Z91.81) Pain - Right/Left: Left Pain - part of body: Ankle and joints of foot     Time: 0915-0940 PT Time Calculation (min) (ACUTE ONLY): 25 min  Charges:  $Therapeutic Activity: 23-37 mins                    11:49 AM, 05/24/22 Rosamaria Lints, PT, DPT Physical Therapist - [facility]  [phone] (ASCOM)    Vickie Hamilton 05/24/2022, 11:47 AM

## 2022-05-24 NOTE — Progress Notes (Signed)
Patient is not able to walk the distance required to go the bathroom, or he/she is unable to safely negotiate stairs required to access the bathroom.  A 3in1 BSC will alleviate this problem  

## 2022-05-24 NOTE — Progress Notes (Signed)
Progress Note   Patient: Vickie Hamilton:[id number] DOB: [birth year] DOA: 05/22/2022     2 DOS: the patient was seen and examined on 05/24/2022   Brief hospital course: HPI on admission 05/22/2022 by Dr. Clyde Lundborg: "Vickie Hamilton is a 57 y.o. female with medical history significant of asthma, GERD, obesity, former smoker, who presents with fall and left ankle pain.   Patient states that she accidentally fell when she was walking down the stair, tripped over steps at about 730 this morning.  No loss of consciousness.  Strongly denies any head or neck injury.  No headache or neck pain.  She does not want to do CT scan of head and neck.  She developed pain in left ankle which has obvious deformity.  The pain is constant, sharp, severe, nonradiating, aggravated by movement.  ..."  ED evaluation revealed patient had left trimalleolar ankle fracture.  She was admitted to the hospital with Orthopedic surgery consulted.  Pt underwent ORIF on day of admission.  Assessment and Plan: * Closed left ankle fracture Status post ORIF on 05/22/22 with Dr. Martha Clan --Orthopedic surgery following --Continue ice and elevation of LLE --Non-weight-bearing on LLE x 6-8 weeks --Lovenox for DVT prophylaxis (ASA 325 mg BID at discharge) --Follow up in Ortho clinic 10-14 days for splint removal, staple removal, xray & casting --Pain control with scheduled Tylenol, PRN oxycodone, IV for breakthrough pain only --PT/OT evaluations --Robaxin PRN for muscle spasms   Fall at home, initial encounter Mechanical in nature, resulting in ankle fracture. --PT/OT --Fall precautions  GERD (gastroesophageal reflux disease) --Protonix, Carafate  HTN (hypertension) --Continue lisinopril --PRN hydralazine  Asthma Stable --Bronchodilators and as needed Mucinex  Leukocytosis Likely reactive.  No fevers or s/sx's of infection. --Monitor CBC  Obesity (BMI 30-39.9) Body mass index is 30.8 kg/m. Complicates overall  care and prognosis.  Recommend lifestyle modifications including physical activity and diet for weight loss and overall long-term health.        Subjective: Pt seen awake resting in bed this AM.  Ankle pain is fairly controlled, gets worse when meds wear off.   She thinks she 'll have adequate help at home during recovery, but if not, plans to stay with daughter. Needs to work with PT on navigating stairs prior to d/c.  Physical Exam: Vitals:   05/23/22 1950 05/23/22 2324 05/24/22 0427 05/24/22 0814  BP: 107/70 (!) 95/41 (!) 104/54 124/65  Pulse: 69 75 75 72  Resp: 16 14 14 18   Temp: 97.6 F (36.4 C) 97.8 F (36.6 C) 97.8 F (36.6 C) 98.3 F (36.8 C)  TempSrc:      SpO2: 98% 99% 95% 100%  Weight:      Height:       General exam: awake, alert, no acute distress HEENT: moist mucus membranes, hearing grossly normal  Respiratory system: on room air, normal respiratory effort. Cardiovascular system: RRR, no peripheral edema.   Gastrointestinal system: soft, NT, ND Central nervous system: A&O x 3. no gross focal neurologic deficits, normal speech Extremities: left ankle splinted with clean dry intact ace wrap in place, left toes have intact sensation and normal temperature and color Skin: dry, intact, normal temperature Psychiatry: normal mood, congruent affect, judgement and insight appear normal   Data Reviewed:  Notable labs ---  WBC 17.0>> 10.5  Family Communication: None present. Pt able to update.  Disposition: Status is: Inpatient Remains inpatient appropriate because: Requires additional PT for navigating stairs at home prior to safe d/c.  Planned Discharge Destination: Home with Home Health    Time spent: 35 minutes  Author: Pennie Banter, DO 05/24/2022 2:40 PM  For on call review [email or web address].

## 2022-05-24 NOTE — Plan of Care (Signed)

## 2022-05-25 DIAGNOSIS — S82892A Other fracture of left lower leg, initial encounter for closed fracture: Secondary | ICD-10-CM | POA: Diagnosis not present

## 2022-05-25 DIAGNOSIS — I10 Essential (primary) hypertension: Secondary | ICD-10-CM

## 2022-05-25 LAB — GLUCOSE, CAPILLARY: Glucose-Capillary: 105 mg/dL — ABNORMAL HIGH (ref 70–99)

## 2022-05-25 MED ORDER — ONDANSETRON 8 MG PO TBDP: 8.0000 mg | ORAL_TABLET | Freq: Three times a day (TID) | ORAL | 0 refills | Status: AC | PRN

## 2022-05-25 MED ORDER — PROSOURCE PLUS PO LIQD
30.0000 mL | Freq: Two times a day (BID) | ORAL | Status: DC
  Administered 2022-05-25: 30 mL via ORAL
  Filled 2022-05-25: qty 30

## 2022-05-25 MED ORDER — LOSARTAN POTASSIUM 50 MG PO TABS: 50.0000 mg | ORAL_TABLET | Freq: Every day | ORAL | Status: DC

## 2022-05-25 MED ORDER — HYDROCHLOROTHIAZIDE 12.5 MG PO TABS
12.5000 mg | ORAL_TABLET | Freq: Every day | ORAL | Status: DC
  Administered 2022-05-25: 12.5 mg via ORAL
  Filled 2022-05-25: qty 1

## 2022-05-25 NOTE — Progress Notes (Signed)
  Subjective:  POD #3 s/p ORIF left ankle.   Patient reports left ankle pain as mild to moderate.    Objective:   VITALS:   Vitals:   05/24/22 1803 05/24/22 2009 05/25/22 0111 05/25/22 0848  BP: (!) 158/87 125/61 (!) 145/73 (!) 147/69  Pulse: 96 79 75 91  Resp: 18 16 20 17   Temp: 98.4 F (36.9 C) 98.7 F (37.1 C) (!) 97.5 F (36.4 C) 98.1 F (36.7 C)  TempSrc:   Oral   SpO2: 100% 100% 94% 95%  Weight:      Height:        PHYSICAL EXAM: Left lower extremity: Splint and dressing remain clean dry and intact.  Patient remains neurovascular intact.  She can flex and extend her toes.  Her toes are well-perfused.  She has intact sensation light touch in all 5 toes.   LABS  Results for orders placed or performed during the hospital encounter of 05/22/22 (from the past 24 hour(s))  Glucose, capillary     Status: Abnormal   Collection Time: 05/25/22  8:59 AM  Result Value Ref Range   Glucose-Capillary 105 (H) 70 - 99 mg/dL    No results found.  Assessment/Plan: 3 Days Post-Op   Principal Problem:   Closed left ankle fracture Active Problems:   Fall at home, initial encounter   GERD (gastroesophageal reflux disease)   Obesity (BMI 30-39.9)   HTN (hypertension)   Asthma   Leukocytosis  Patient is progressing with PT. She will attempt stairs again this afternoon. If she is able to navigate stairs she may be discharged home from an orthopedic standpoint once cleared medically. Recommend patient take enteric-coated aspirin 325 mg p.o. twice daily for DVT prophylaxis till follow-up. Will follow-up with [facility] in [place] in 2 weeks for staple removal and cast placement. Patient must remain nonweightbearing for a total of 6 to 8 weeks postop. She will use her walker for assistance with ambulation.     Juanell Fairly , MD 05/25/2022, 2:17 PM

## 2022-05-25 NOTE — Progress Notes (Signed)
Physical Therapy Treatment Patient Details Name: Vickie Hamilton MRN: [id number] DOB: October 31, 1965 Today's Date: 05/25/2022   History of Present Illness Vickie Hamilton is a 57 y.o. female with medical history significant of asthma, GERD, obesity, former smoker, who presents with fall and left ankle pain. Trimalleolar fracture s/p ORIF 05/22/22. NWB L LE.    PT Comments    Coordinated arrival of DTR who will transport at DC and assist with home egress. Pt taken to stairwell for a wider, deeper step option. Pt able to use '[place]-squat' positioned RW to ascend 4 steps, but fatigue, SOB, and falls anxiety stopped a necessary 5th step from happening. We attempted 2 other means of egress stairs training and ultimately found a method that will accommodate pt ability and home environment. Pt is very please to have found a solution. MD/RN made aware. DTR in room at end of session.  *see 'other exercises' for stairs training details   Recommendations for follow up therapy are one component of a multi-disciplinary discharge planning process, led by the attending physician.  Recommendations may be updated based on patient status, additional functional criteria and insurance authorization.  Follow Up Recommendations       Assistance Recommended at Discharge Intermittent Supervision/Assistance  Patient can return home with the following A little help with walking and/or transfers;A little help with bathing/dressing/bathroom;Assist for transportation;Help with stairs or ramp for entrance;Assistance with cooking/housework   Equipment Recommendations  Rolling walker (2 wheels);BSC/3in1    Recommendations for Other Services       Precautions / Restrictions Precautions Precautions: Fall Precaution Comments: splint on L foot/ankle Splint/Cast - Date Prophylactic Dressing Applied (if applicable): 05/22/22 Restrictions LLE Weight Bearing: Non weight bearing     Mobility  Bed Mobility Overal bed  mobility: Modified Independent                  Transfers Overall transfer level: Modified independent Equipment used: Rolling walker (2 wheels) Transfers: Sit to/from Stand, Bed to chair/wheelchair/BSC Sit to Stand: Modified independent (Device/Increase time) Stand pivot transfers: Modified independent (Device/Increase time)         General transfer comment: can perform from standard height surface with improved confidence. 5x in session overall    Ambulation/Gait Ambulation/Gait assistance:  (mobilizing in room frequently with success and confidence.) Gait Distance (Feet): 18 Feet (same as before: 18 to BR, 18 back to bed) Assistive device: Rolling walker (2 wheels)         General Gait Details: maintains NWB; upright nausea with effort is improving but still slightly there   Stairs Stairs: Yes Stairs assistance: Min guard Stair Management: No rails, Backwards, With walker Number of Stairs: 8 (4 up, 4 down) General stair comments: more anioux with height, has a fear of heights.   Wheelchair Mobility    Modified Rankin (Stroke Patients Only)       Balance                                            Cognition                                                Exercises Other Exercises Other Exercises: in stairwell 4 sequential steps up (backwards, LLE NWB, 2UE  support on RW); failed attempt for 5th step, then heightened anxious becomes self defeat. Other Exercises: in stairwell 4 sequential steps down (forwards, LLE NWB, 2UE support on RW) good eccentric control Other Exercises: demonstration and setup for 1 rail left, 1 AC Right: before first attempt can be made, pt indicates that her DTR's railing would not suffice for heavy use. Other Exercises: In stairwell pt demonstrates retro step backup to 6 sequential steps, transfer to sitting on 2nd step, posterior scoot up 4 steps, then fails to rise to standing from there are  anticipate with help from DTR (again avoiding railings as they will not be availabel; pt ultimately forward scoots down 3 steps, then we practice return to standing with DTR providing 2 hand assist and author providing support at bilater axillae from behind. (Scoot looks good, but pt unable to come to standing as Chartered loss adjuster predicted.) Other Exercises: In room 143: transfer anterior from recliner to 8" step, then anterior scoot from 8" step to floor mat; then performed in reverse as a means to teach a method for floor to chair at home. Family will plan on getting a step stool enroute home.    General Comments        Pertinent Vitals/Pain Pain Assessment Pain Assessment: 0-10 Faces Pain Scale: Hurts a little bit Pain Location: L LE Pain Descriptors / Indicators: Throbbing Pain Intervention(s): Limited activity within patient's tolerance    Home Living                          Prior Function            PT Goals (current goals can now be found in the care plan section) Acute Rehab PT Goals Patient Stated Goal: to improve, return to work PT Goal Formulation: With patient Time For Goal Achievement: 06/04/22 Potential to Achieve Goals: Good Progress towards PT goals: Progressing toward goals    Frequency    BID      PT Plan Current plan remains appropriate    Co-evaluation              AM-PAC PT "6 Clicks" Mobility   Outcome Measure  Help needed turning from your back to your side while in a flat bed without using bedrails?: None Help needed moving from lying on your back to sitting on the side of a flat bed without using bedrails?: None Help needed moving to and from a bed to a chair (including a wheelchair)?: None Help needed standing up from a chair using your arms (e.g., wheelchair or bedside chair)?: None Help needed to walk in hospital room?: A Little Help needed climbing 3-5 steps with a railing? : A Little 6 Click Score: 22    End of Session  Equipment Utilized During Treatment: Gait belt Activity Tolerance: Patient tolerated treatment well;No increased pain;Patient limited by fatigue;Other (comment) (anxiety) Patient left: with call bell/phone within reach;in chair;with family/visitor present Nurse Communication: Mobility status PT Visit Diagnosis: Other abnormalities of gait and mobility (R26.89);Unsteadiness on feet (R26.81);Difficulty in walking, not elsewhere classified (R26.2);Pain;History of falling (Z91.81) Pain - Right/Left: Left Pain - part of body: Ankle and joints of foot     Time: 0981-1914 PT Time Calculation (min) (ACUTE ONLY): 39 min  Charges:  $Gait Training: 38-52 mins                    3:44 PM, 05/25/22 Rosamaria Lints, PT, DPT Physical Therapist - [place]  [facility]  [phone] (ASCOM)    Romel Dumond C 05/25/2022, 3:41 PM

## 2022-05-25 NOTE — Discharge Summary (Signed)
Physician Discharge Summary   Patient: Vickie Hamilton MRN: [id number] DOB: 04/15/1965  Admit date:     05/22/2022  Discharge date: 05/25/22  Discharge Physician: Pennie Banter   PCP: [facility]   Recommendations at discharge:    Follow up with Orthopedic surgery as scheduled Follow up with Primary Care in 1-2 weeks Repeat CBC, BMP in 1-2 weeks   Discharge Diagnoses: Principal Problem:   Closed left ankle fracture Active Problems:   Fall at home, initial encounter   GERD (gastroesophageal reflux disease)   HTN (hypertension)   Asthma   Leukocytosis   Obesity (BMI 30-39.9)  Resolved Problems:   * No resolved hospital problems. [facility] Course: HPI on admission 05/22/2022 by Dr. Clyde Lundborg: "Vickie Hamilton is a 57 y.o. female with medical history significant of asthma, GERD, obesity, former smoker, who presents with fall and left ankle pain.   Patient states that she accidentally fell when she was walking down the stair, tripped over steps at about 730 this morning.  No loss of consciousness.  Strongly denies any head or neck injury.  No headache or neck pain.  She does not want to do CT scan of head and neck.  She developed pain in left ankle which has obvious deformity.  The pain is constant, sharp, severe, nonradiating, aggravated by movement.  ..."  ED evaluation revealed patient had left trimalleolar ankle fracture.  She was admitted to the hospital with Orthopedic surgery consulted.  Pt underwent ORIF on day of admission.  Further hospital course and management as outlined below.  5/21 -- pt doing well.  Was able to navigate stairs with PT today.  Stable for discharge.    Assessment and Plan: * Closed left ankle fracture Status post ORIF on 05/22/22 with Dr. Martha Clan --Orthopedic surgery following --Continue ice and elevation of LLE --Non-weight-bearing on LLE x 6-8 weeks --Lovenox here for DVT prophylaxis  --Dicharge on ASA 325 mg BID with  meals --Follow up in Ortho clinic 10-14 days for splint removal, staple removal, xray & casting --Pain control with scheduled Tylenola and tramadol, PRN oxycodone --Bowel regimen --Zofran PRN if nausea from pain meds --PT/OT evaluations - Home health --Robaxin PRN for muscle spasms   Fall at home, initial encounter Mechanical in nature, resulting in ankle fracture. --PT/OT --Fall precautions  GERD (gastroesophageal reflux disease) --Protonix, Carafate  HTN (hypertension) --Continue lisinopril --PRN hydralazine  Asthma Stable --Bronchodilators and as needed Mucinex  Leukocytosis Likely reactive.  No fevers or s/sx's of infection. --Monitor CBC  Obesity (BMI 30-39.9) Body mass index is 30.8 kg/m. Complicates overall care and prognosis.  Recommend lifestyle modifications including physical activity and diet for weight loss and overall long-term health.         Consultants: Orthopedic surgery Procedures performed: OPEN REDUCTION INTERNAL FIXATION (ORIF) LEFT TRIMALLEOLAR ANKLE FRACTURE   Disposition: Home health  Diet recommendation:  Discharge Diet Orders (From admission, onward)     Start     Ordered   05/24/22 0000  Diet - low sodium heart healthy        05/24/22 1146           Cardiac diet DISCHARGE MEDICATION: Allergies as of 05/25/2022       Reactions   Asa [aspirin] Other (See Comments)   Reaction : Stomach irritation, GI upset   Naproxen    Reaction : Stomach irritation, GI upset   Morphine Other (See Comments)   Patient says it makes her feel like  she has a "ball in her abdomen"        Medication List     STOP taking these medications    chlorpheniramine-HYDROcodone 10-8 MG/5ML Suer Commonly known as: Tussionex Pennkinetic ER   lisinopril 10 MG tablet Commonly known as: ZESTRIL   pantoprazole 40 MG tablet Commonly known as: Protonix   sucralfate 1 g tablet Commonly known as: Carafate       TAKE these medications     acetaminophen 500 MG tablet Commonly known as: TYLENOL Take 2 tablets (1,000 mg total) by mouth every 8 (eight) hours.   albuterol 108 (90 Base) MCG/ACT inhaler Commonly known as: VENTOLIN HFA Inhale 4-6 puffs by mouth every 4 hours as needed for wheezing, cough, and/or shortness of breath   ALPRAZolam 0.25 MG tablet Commonly known as: XANAX Take 0.25 mg by mouth at bedtime as needed.   aspirin EC 325 MG tablet Take 1 tablet (325 mg total) by mouth 2 (two) times daily with a meal for 14 days.   budesonide-formoterol 80-4.5 MCG/ACT inhaler Commonly known as: Symbicort Inhale 2 puffs into the lungs 2 (two) times daily.   docusate sodium 100 MG capsule Commonly known as: COLACE Take 1 capsule (100 mg total) by mouth 2 (two) times daily.   feeding supplement Liqd Take 237 mLs by mouth daily.   fluticasone-salmeterol 500-50 MCG/ACT Aepb Commonly known as: ADVAIR Inhale 1 puff into the lungs in the morning and at bedtime.   losartan-hydrochlorothiazide 50-12.5 MG tablet Commonly known as: HYZAAR Take 1 tablet by mouth daily.   methocarbamol 500 MG tablet Commonly known as: ROBAXIN Take 1 tablet (500 mg total) by mouth every 6 (six) hours as needed for muscle spasms.   multivitamin with minerals Tabs tablet Take 1 tablet by mouth daily.   ondansetron 4 MG disintegrating tablet Commonly known as: Zofran ODT Take 1 tablet (4 mg total) by mouth every 6 (six) hours as needed for nausea or vomiting. What changed: Another medication with the same name was added. Make sure you understand how and when to take each.   ondansetron 8 MG disintegrating tablet Commonly known as: ZOFRAN-ODT Take 1 tablet (8 mg total) by mouth every 8 (eight) hours as needed for nausea or vomiting. What changed: You were already taking a medication with the same name, and this prescription was added. Make sure you understand how and when to take each.   oxyCODONE 5 MG immediate release  tablet Commonly known as: Oxy IR/ROXICODONE Take 1-2 tablets (5-10 mg total) by mouth every 4 (four) hours as needed for moderate pain or severe pain.   senna 8.6 MG Tabs tablet Commonly known as: SENOKOT Take 1 tablet (8.6 mg total) by mouth 2 (two) times daily.   traMADol 50 MG tablet Commonly known as: ULTRAM Take 1 tablet (50 mg total) by mouth every 6 (six) hours.               Durable Medical Equipment  (From admission, onward)           Start     Ordered   05/24/22 1058  For home use only DME Walker rolling  Once       Question Answer Comment  Walker: With 5 Inch Wheels   Patient needs a walker to treat with the following condition Impaired mobility      05/24/22 1058   05/24/22 1058  For home use only DME Bedside commode  Once       Question:  Patient needs a bedside commode to treat with the following condition  Answer:  Impaired mobility   05/24/22 1058            Discharge Exam: Filed Weights   05/22/22 0748 05/23/22 0854  Weight: 81.6 kg 81.4 kg   General exam: awake, alert, no acute distress HEENT: moist mucus membranes, hearing grossly normal  Respiratory system: on room air, normal respiratory effort. Cardiovascular system: RRR, no pedal edema.   Central nervous system: A&O x4. no gross focal neurologic deficits, normal speech Extremities: left ankle splint and clean dry intact dressing in place Skin: dry, intact, no rashes seen on visualized skin Psychiatry: normal mood, congruent affect, judgement and insight appear normal   Condition at discharge: stable  The results of significant diagnostics from this hospitalization (including imaging, microbiology, ancillary and laboratory) are listed below for reference.   Imaging Studies: DG Ankle Left Port  Result Date: 05/22/2022 CLINICAL DATA:  Trimalleolar fracture, status post fixation EXAM: PORTABLE LEFT ANKLE - 2 VIEW COMPARISON:  Earlier today most recent 10:29 a.m. FINDINGS: 5:05 p.m.  Plaster splint obscures bony detail. Plate and screw fixation of the distal fibula with anatomic alignment. Leg screw fixation of the medial malleolus. Ankle mortise is intact. Diffuse soft tissue swelling. IMPRESSION: Interval internal fixation with anatomic alignment. Electronically Signed   By: Jeronimo Greaves M.D.   On: 05/22/2022 17:27   DG MINI C-ARM IMAGE ONLY  Result Date: 05/22/2022 There is no interpretation for this exam.  This order is for images obtained during a surgical procedure.  Please See "Surgeries" Tab for more information regarding the procedure.   DG Ankle Complete Left  Result Date: 05/22/2022 CLINICAL DATA:  post reduction EXAM: LEFT ANKLE COMPLETE - 3+ VIEW COMPARISON:  Earlier films of the same day FINDINGS: Comminuted distal fibular shaft fracture fragments with reduction of previous angulation deformity. Significant reduction of the comminuted medial malleolar fracture deformity. The tibiotalar alignment appears grossly normal. Minimally displaced posterior malleolar fracture. Medial soft tissue swelling. Calcaneal spurs. IMPRESSION: 1. Reduction of comminuted distal fibular and medial malleolar fractures. 2. Minimally displaced posterior malleolar fracture. Electronically Signed   By: Corlis Leak M.D.   On: 05/22/2022 10:37   DG Ankle Complete Left  Result Date: 05/22/2022 CLINICAL DATA:  Fall. Ankle pain and deformity. Fell down steps this morning. EXAM: LEFT FOOT - COMPLETE 3+ VIEW; LEFT ANKLE COMPLETE - 3+ VIEW COMPARISON:  None. FINDINGS: Fracture dislocation of the distal tibia and fibula is present with 40 degrees angulation of a comminuted fibular fracture. Avulsion of the medial malleolus is present. Fibular fracture is comminuted. Talus is displaced laterally relative to the distal tibia. No additional fractures are present foot. Mild degenerative changes are present midfoot. Prominent calcaneal spur is noted. IMPRESSION: 1. Fracture dislocation of the distal tibia and  fibula with 40 degrees angulation of a comminuted fibular fracture. 2. Avulsion of the medial malleolus. Electronically Signed   By: Marin Roberts M.D.   On: 05/22/2022 08:34   DG Foot Complete Left  Result Date: 05/22/2022 CLINICAL DATA:  Fall. Ankle pain and deformity. Fell down steps this morning. EXAM: LEFT FOOT - COMPLETE 3+ VIEW; LEFT ANKLE COMPLETE - 3+ VIEW COMPARISON:  None. FINDINGS: Fracture dislocation of the distal tibia and fibula is present with 40 degrees angulation of a comminuted fibular fracture. Avulsion of the medial malleolus is present. Fibular fracture is comminuted. Talus is displaced laterally relative to the distal tibia. No additional fractures are present foot.  Mild degenerative changes are present midfoot. Prominent calcaneal spur is noted. IMPRESSION: 1. Fracture dislocation of the distal tibia and fibula with 40 degrees angulation of a comminuted fibular fracture. 2. Avulsion of the medial malleolus. Electronically Signed   By: Marin Roberts M.D.   On: 05/22/2022 08:34    Microbiology: Results for orders placed or performed during the hospital encounter of 05/22/22  Surgical PCR screen     Status: None   Collection Time: 05/22/22  6:00 PM   Specimen: Nasal Mucosa; Nasal Swab  Result Value Ref Range Status   MRSA, PCR NEGATIVE NEGATIVE Final   Staphylococcus aureus NEGATIVE NEGATIVE Final    Comment: (NOTE) The Xpert SA Assay (FDA approved for NASAL specimens in patients 64 years of age and older), is one component of a comprehensive surveillance program. It is not intended to diagnose infection nor to guide or monitor treatment. Performed at [facility], [address] [place], [place] [address]     Labs: CBC: Recent Labs  Lab 05/22/22 1104 05/23/22 0430 05/24/22 0444  WBC 17.1* 17.0* 10.5  HGB 14.3 13.3 12.2  HCT 42.5 39.7 37.7  MCV 87.1 89.0 90.2  PLT 365 379 370   Basic Metabolic Panel: Recent Labs  Lab 05/22/22 1104  05/23/22 0430  NA 137 138  K 3.5 3.6  CL 101 100  CO2 27 27  GLUCOSE 125* 130*  BUN 22* 20  CREATININE 0.89 0.96  CALCIUM 9.0 8.8*   Liver Function Tests: No results for input(s): "AST", "ALT", "ALKPHOS", "BILITOT", "PROT", "ALBUMIN" in the last 168 hours. CBG: Recent Labs  Lab 05/23/22 0827 05/24/22 0752 05/25/22 0859  GLUCAP 107* 110* 105*    Discharge time spent: less than 30 minutes.  Signed: Pennie Banter, DO [facility] 05/25/2022

## 2022-05-25 NOTE — Progress Notes (Signed)
Physical Therapy Treatment Patient Details Name: Vickie Hamilton MRN: [id number] DOB: November 14, 1965 Today's Date: 05/25/2022   History of Present Illness Vickie Hamilton is a 57 y.o. female with medical history significant of asthma, GERD, obesity, former smoker, who presents with fall and left ankle pain. Trimalleolar fracture s/p ORIF 05/22/22. NWB L LE.    PT Comments    Pt in bed on entry, meal done, agreeable to PT session. Stairs training is 75% to goal. Performance indicated adequate strength. Technique is becoming more familiar. Confidence is steadily replacing anxiety, although heightens when attempting sequential steps with increased pt height to floor ratio. We're planning for an afternoon DC and her DTR will be here again for stairs practice once again. Pt offered encouraging and empowering words.      Recommendations for follow up therapy are one component of a multi-disciplinary discharge planning process, led by the attending physician.  Recommendations may be updated based on patient status, additional functional criteria and insurance authorization.  Follow Up Recommendations       Assistance Recommended at Discharge Intermittent Supervision/Assistance  Patient can return home with the following A little help with walking and/or transfers;A little help with bathing/dressing/bathroom;Assist for transportation;Help with stairs or ramp for entrance;Assistance with cooking/housework   Equipment Recommendations  Rolling walker (2 wheels);BSC/3in1    Recommendations for Other Services       Precautions / Restrictions Precautions Precautions: Fall Precaution Comments: splint on L foot/ankle Splint/Cast - Date Prophylactic Dressing Applied (if applicable): 05/22/22 Restrictions Weight Bearing Restrictions: Yes LLE Weight Bearing: Non weight bearing     Mobility  Bed Mobility Overal bed mobility: Modified Independent                  Transfers Overall transfer  level: Needs assistance Equipment used: Rolling walker (2 wheels) Transfers: Sit to/from Stand Sit to Stand: Supervision           General transfer comment: can perform from standard height surface with improved confidence. 5x in session overall    Ambulation/Gait Ambulation/Gait assistance:  (mobilizing in room frequently with success and confidence.) Gait Distance (Feet): 18 Feet (same as before: 18 to BR, 18 back to bed) Assistive device: Rolling walker (2 wheels)         General Gait Details: maintains NWB; upright nausea with effort is improving but still slightly there   Stairs Stairs: Yes Stairs assistance: Min guard Stair Management: No rails, Backwards, With walker Number of Stairs: 10 (4x up, 4x down 4" step, 4x up, 4x down 5" step; 2x up, 2x down 6" step; then authro demomstrates 1 railing 1 crutch for afternoon consideration) General stair comments: less anxious from lower steps, improved anxiety on training steps   Wheelchair Mobility    Modified Rankin (Stroke Patients Only)       Balance                                            Cognition                                                Exercises      General Comments        Pertinent Vitals/Pain Pain Assessment Pain Assessment: Faces Faces  Pain Scale: Hurts little more Pain Location: L LE Pain Intervention(s): Limited activity within patient's tolerance, Monitored during session, Premedicated before session, Repositioned (at goal)    Home Living                          Prior Function            PT Goals (current goals can now be found in the care plan section) Acute Rehab PT Goals Patient Stated Goal: to improve, return to work PT Goal Formulation: With patient Time For Goal Achievement: 06/04/22 Potential to Achieve Goals: Good Progress towards PT goals: Progressing toward goals    Frequency    BID      PT Plan Current  plan remains appropriate    Co-evaluation              AM-PAC PT "6 Clicks" Mobility   Outcome Measure  Help needed turning from your back to your side while in a flat bed without using bedrails?: None Help needed moving from lying on your back to sitting on the side of a flat bed without using bedrails?: None Help needed moving to and from a bed to a chair (including a wheelchair)?: A Little Help needed standing up from a chair using your arms (e.g., wheelchair or bedside chair)?: A Little Help needed to walk in hospital room?: A Little Help needed climbing 3-5 steps with a railing? : A Lot 6 Click Score: 19    End of Session Equipment Utilized During Treatment: Gait belt Activity Tolerance: Patient tolerated treatment well;No increased pain Patient left: with call bell/phone within reach;in chair (in BR) Nurse Communication: Mobility status PT Visit Diagnosis: Other abnormalities of gait and mobility (R26.89);Unsteadiness on feet (R26.81);Difficulty in walking, not elsewhere classified (R26.2);Pain;History of falling (Z91.81) Pain - Right/Left: Left Pain - part of body: Ankle and joints of foot     Time: 0957-1040 PT Time Calculation (min) (ACUTE ONLY): 43 min  Charges:  $Gait Training: 38-52 mins                10:53 AM, 05/25/22 Rosamaria Lints, PT, DPT Physical Therapist - [facility]  [phone] (ASCOM)    Emile Ringgenberg C 05/25/2022, 10:51 AM

## 2023-03-23 ENCOUNTER — Other Ambulatory Visit: Payer: Self-pay | Admitting: Orthopedic Surgery

## 2023-03-23 DIAGNOSIS — S93429A Sprain of deltoid ligament of unspecified ankle, initial encounter: Secondary | ICD-10-CM

## 2023-03-25 ENCOUNTER — Ambulatory Visit
Admission: RE | Admit: 2023-03-25 | Discharge: 2023-03-25 | Disposition: A | Discharge status: Home / Self Care | Source: Ambulatory Visit | Attending: Orthopedic Surgery | Admitting: Orthopedic Surgery

## 2023-03-25 DIAGNOSIS — S93429A Sprain of deltoid ligament of unspecified ankle, initial encounter: Secondary | ICD-10-CM | POA: Insufficient documentation

## 2023-05-29 IMAGING — CR DG CHEST 2V
2 series · 2 of 2 positions shown · non-contrast
Comparison: PA and lateral chest 02/04/2016.

CLINICAL DATA: Fever, cough, nausea and weakness.

EXAM:
CHEST - 2 VIEW

[chest lat]
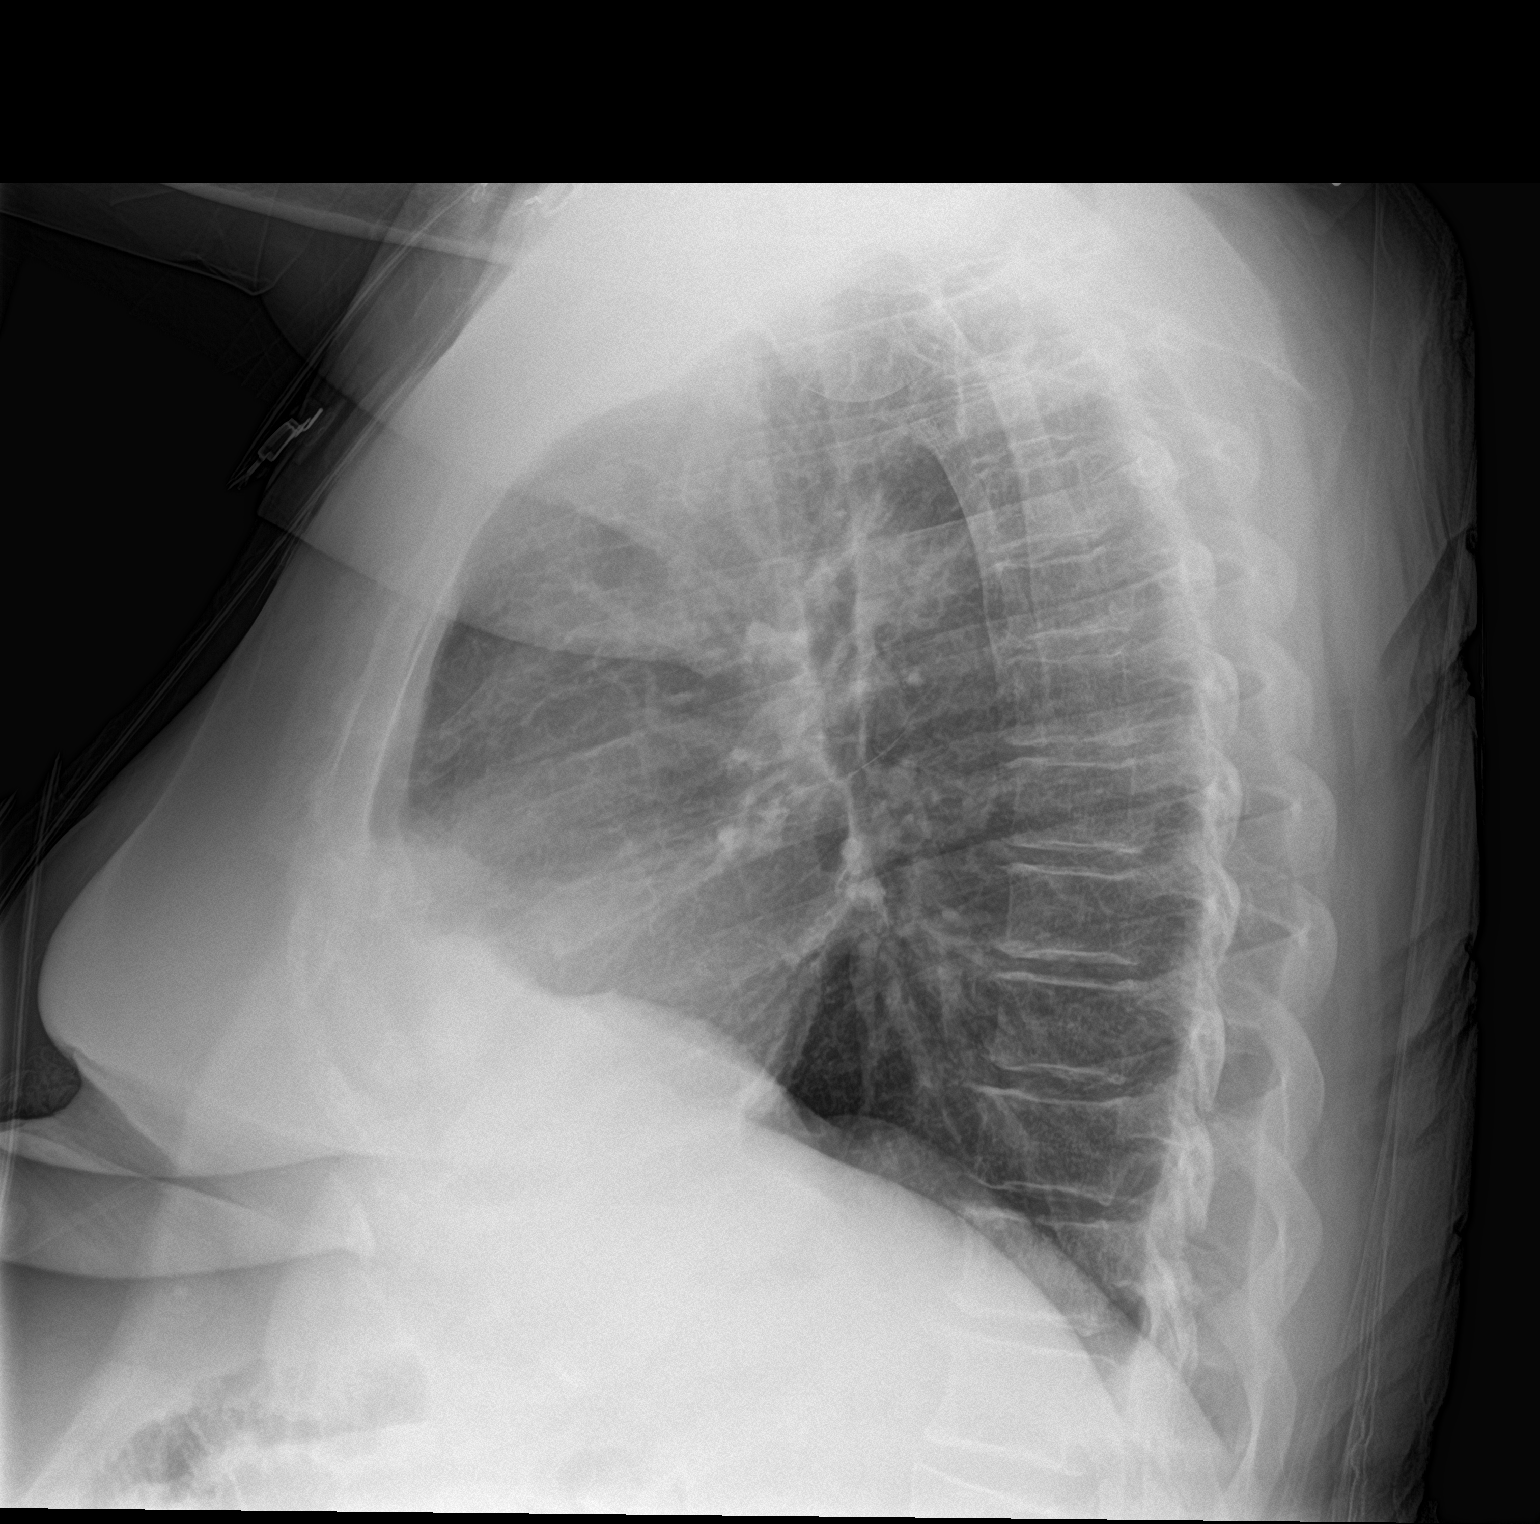

[chest ap]
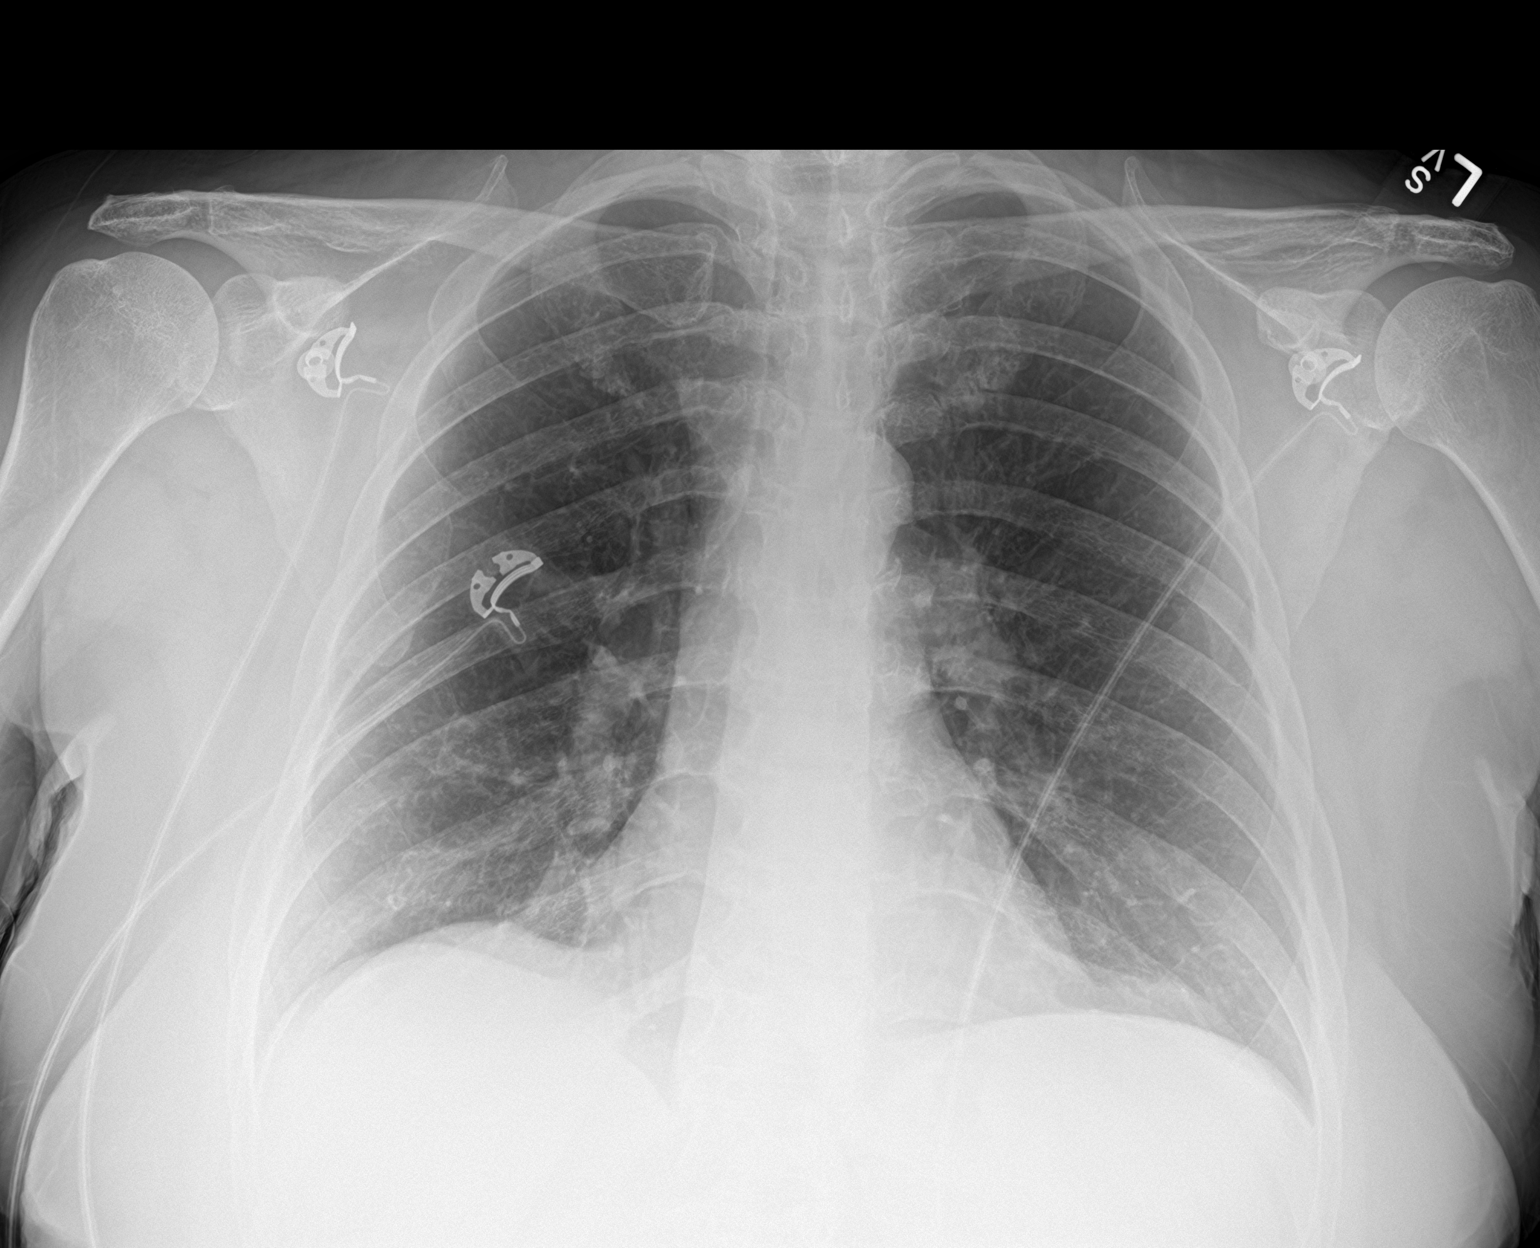

[2 of 2 positions shown; findings below may reference images not displayed]

FINDINGS: Lungs clear. Heart size normal. No pneumothorax or pleural fluid. No
acute or focal bony abnormality.
IMPRESSION: Negative chest.

## 2023-08-27 ENCOUNTER — Other Ambulatory Visit: Payer: Self-pay

## 2023-08-27 ENCOUNTER — Encounter: Payer: Self-pay | Admitting: Emergency Medicine

## 2023-08-27 ENCOUNTER — Emergency Department
Admission: EM | Admit: 2023-08-27 | Discharge: 2023-08-27 | Disposition: A | Discharge status: Home / Self Care | Attending: Emergency Medicine | Admitting: Emergency Medicine

## 2023-08-27 ENCOUNTER — Emergency Department

## 2023-08-27 DIAGNOSIS — Z23 Encounter for immunization: Secondary | ICD-10-CM | POA: Insufficient documentation

## 2023-08-27 DIAGNOSIS — I10 Essential (primary) hypertension: Secondary | ICD-10-CM | POA: Diagnosis not present

## 2023-08-27 DIAGNOSIS — S81812A Laceration without foreign body, left lower leg, initial encounter: Secondary | ICD-10-CM | POA: Diagnosis present

## 2023-08-27 DIAGNOSIS — W19XXXA Unspecified fall, initial encounter: Secondary | ICD-10-CM

## 2023-08-27 DIAGNOSIS — W172XXA Fall into hole, initial encounter: Secondary | ICD-10-CM | POA: Diagnosis not present

## 2023-08-27 DIAGNOSIS — J45909 Unspecified asthma, uncomplicated: Secondary | ICD-10-CM | POA: Diagnosis not present

## 2023-08-27 MED ORDER — LIDOCAINE-EPINEPHRINE-TETRACAINE (LET) TOPICAL GEL
3.0000 mL | Freq: Once | TOPICAL | Status: AC
  Administered 2023-08-27: 3 mL via TOPICAL
  Filled 2023-08-27: qty 3

## 2023-08-27 MED ORDER — TETANUS-DIPHTH-ACELL PERTUSSIS 5-2.5-18.5 LF-MCG/0.5 IM SUSY
0.5000 mL | PREFILLED_SYRINGE | Freq: Once | INTRAMUSCULAR | Status: AC
  Administered 2023-08-27: 0.5 mL via INTRAMUSCULAR
  Filled 2023-08-27: qty 0.5

## 2023-08-27 NOTE — ED Triage Notes (Signed)
 Pt via POV from home. Pt here for a mechanical fall, reports falling onto cement. Pt c/o L ankle pain and L lower leg pain. Pt has an abrasion to the L shin. Bleeding controlled. Pt is A&Ox4 and NAD

## 2023-08-27 NOTE — ED Provider Notes (Signed)
 [facility] Provider Note    Event Date/Time   First MD Initiated Contact with Patient 08/27/23 1535     (approximate)   History   Fall   HPI  Vickie Hamilton is a 58 y.o. female  with a past medical history of ORIF of left ankle, GERD, HTN, asthma presents to the emergency department following a mechanical fall that occurred around 1 PM today.  Patient reports falling on the cement after her left ankle twisted in a hole. Patient has abrasion to left knee and wound to left anterior leg. Has not taken anything for pain. Patient denies hitting her head, tingling, fever, chills, pus-like drainage. Numbness, tingling.  Patient does not take any blood thinners. Unsure of last tetanus injection.    Reviewed note from 05/25/2022 discharge note and 05/22/2022 when patient had ORIF for left trimalleolar ankle fracture dislocation with Dr. Franky Cranker.  Physical Exam   Triage Vital Signs: ED Triage Vitals  Encounter Vitals Group     BP 08/27/23 1415 (!) 156/91     Girls Systolic BP Percentile --      Girls Diastolic BP Percentile --      Boys Systolic BP Percentile --      Boys Diastolic BP Percentile --      Pulse Rate 08/27/23 1415 (!) 101     Resp 08/27/23 1415 18     Temp 08/27/23 1415 98.1 F (36.7 C)     Temp Source 08/27/23 1415 Oral     SpO2 08/27/23 1415 99 %     Weight 08/27/23 1413 190 lb (86.2 kg)     Height 08/27/23 1413 5' 4 (1.626 m)     Head Circumference --      Peak Flow --      Pain Score 08/27/23 1413 10     Pain Loc --      Pain Education --      Exclude from Growth Chart --     Most recent vital signs: Vitals:   08/27/23 1415 08/27/23 1705  BP: (!) 156/91 (!) 158/62  Pulse: (!) 101 98  Resp: 18 17  Temp: 98.1 F (36.7 C) 97.7 F (36.5 C)  SpO2: 99% 100%    General: Awake, in no acute distress. Appears stated age. Head: Normocephalic, atraumatic. CV: Good peripheral perfusion.  Respiratory:Normal respiratory effort.   No respiratory distress.  GI: Soft, non-distended.  MSK: Normal ROM and 5/5 strength in b/l knees, ankles and toes.  Skin:Warm, dry. Two healed scars from prior surgery, one on medial malleolus and one on lateral malleolus extending into distal fibula. Swelling noted around lateral malleolus. Left foot and ankle has no TTP. 5 cm skin tear noted to anterior left shin, bleeding controlled. Abrasion to left anterior knee. Neurological: A&Ox4. Sensation intact to b/l ankles and feet. Strength symmetric.  ED Results / Procedures / Treatments   Labs (all labs ordered are listed, but only abnormal results are displayed) Labs Reviewed - No data to display   EKG     RADIOLOGY X rays left tib-fib and ankle ordered.  Left tib-fib  IMPRESSION: 1. No acute fracture or dislocation. 2. Distal tibia and fibular hardware is intact.  Left ankle IMPRESSION: 1. No acute osseous abnormality. 2. Chronic narrowing of the medial ankle mortise and degenerative tibiotalar change, stable from prior. 3. Intact distal tibia and fibular hardware.  PROCEDURES:  Critical Care performed: No   Wound repair  Date/Time: 08/27/2023 5:07 PM  Performed by: Sheron Salm, PA-C Authorized by: Sheron Salm, PA-C  Consent: Verbal consent obtained Risks and benefits: risks, benefits and alternatives were discussed Consent given by: patient Patient understanding: patient states understanding of the procedure being performed Imaging studies: imaging studies available Patient identity confirmed: verbally with patient Local anesthesia used: yes Anesthesia: local infiltration  Anesthesia: Local anesthesia used: yes Local Anesthetic: LET (lido, epi, tetracaine ) Anesthetic total: 3 mL  Sedation: Patient sedated: no  Patient tolerance: patient tolerated the procedure well with no immediate complications Comments: Wound area cleansed with betadine, flushed with saline, then applied LET for topical  anesthesia. Wound dressed with nonadherent pads, gauze dressing.      MEDICATIONS ORDERED IN ED: Medications  Tdap (BOOSTRIX) injection 0.5 mL (0.5 mLs Intramuscular Given 08/27/23 1633)  lidocaine -EPINEPHrine -tetracaine  (LET) topical gel (3 mLs Topical Given 08/27/23 1630)     IMPRESSION / MDM / ASSESSMENT AND PLAN / ED COURSE  I reviewed the triage vital signs and the nursing notes.                              Differential diagnosis includes, but is not limited to, skin tear, abrasion, laceration, ankle sprain, ankle fracture  Patient's presentation is most consistent with acute complicated illness / injury requiring diagnostic workup.  Patient is a 58 year old female following a mechanical fall after twisting left ankle in a hole.  Patient has skin tear noted to left anterior shin, wound care with LET applied for anesthesia; please see procedure note for full details. Tetanus updated. X-ray of the knee left ankle without any acute fracture or dislocation.  I independently reviewed and interpreted the x-ray as well as radiologist report.  I agree with the radiologist report that all hardware is intact and no acute findings. Skin tear wound care instructions discussed with the patient.  Ambulatory referral sent to local wound care center for wound recheck in 48 hours.  Patient alerted to return to the ER or call her primary care provider at [facility] if signs of infection.  The patient may return to the emergency department for any new, worsening, or concerning symptoms. Patient was given the opportunity to ask questions; all questions were answered. Emergency department return precautions were discussed with the patient.  Patient is in agreement to the treatment plan.  Patient is stable for discharge.  FINAL CLINICAL IMPRESSION(S) / ED DIAGNOSES   Final diagnoses:  Fall, initial encounter  Skin tear of left lower leg without complication, initial encounter     Rx / DC Orders   ED  Discharge Orders          Ordered    AMB referral to wound care center        08/27/23 1652             Note:  This document was prepared using Dragon voice recognition software and may include unintentional dictation errors.     Sheron Salm, PA-C 08/27/23 1712    Dorothyann Drivers, MD 08/27/23 [phone]

## 2023-08-27 NOTE — Discharge Instructions (Addendum)
 Please read the instructions pertaining to skin tears.  You may clean the area with nonscented, antibacterial soap and water every day and change the dressing once daily.  Please use nonstick pads and gauze for the dressing.  You may apply a triple antibiotic ointment or Vaseline around the skin to keep the area moisturized.  Please do not submerge the area in water.  Please follow-up with the wound care center listed in this paperwork in 48 hours.  Alternatively, may follow-up with your primary care provider at [facility] in 48 hours for wound recheck.  Please return to the emergency department if you experience any fever, chills, puslike drainage, increasing redness or swelling to the affected area, or any other new, worsening, or concerning symptoms.
